# Patient Record
Sex: Male | Born: 1940 | Race: White | Hispanic: No | Marital: Married | Smoking: Never smoker
Health system: Southern US, Community
[De-identification: ages and names within clinical notes are randomized; demographics above are authoritative.]

## PROBLEM LIST (undated history)

## (undated) DIAGNOSIS — I10 Essential (primary) hypertension: Secondary | ICD-10-CM

## (undated) DIAGNOSIS — I2510 Atherosclerotic heart disease of native coronary artery without angina pectoris: Secondary | ICD-10-CM

## (undated) DIAGNOSIS — IMO0002 Reserved for concepts with insufficient information to code with codable children: Secondary | ICD-10-CM

## (undated) DIAGNOSIS — K5792 Diverticulitis of intestine, part unspecified, without perforation or abscess without bleeding: Secondary | ICD-10-CM

## (undated) DIAGNOSIS — I442 Atrioventricular block, complete: Secondary | ICD-10-CM

## (undated) HISTORY — PX: BACK SURGERY: SHX140

## (undated) HISTORY — DX: Atherosclerotic heart disease of native coronary artery without angina pectoris: I25.10

## (undated) HISTORY — DX: Atrioventricular block, complete: I44.2

## (undated) HISTORY — PX: BOWEL RESECTION: SHX1257

## (undated) HISTORY — PX: CARDIAC CATHETERIZATION: SHX172

---

## 1999-11-03 ENCOUNTER — Encounter: Payer: Self-pay | Admitting: Neurosurgery

## 1999-11-03 ENCOUNTER — Ambulatory Visit (HOSPITAL_COMMUNITY): Admission: RE | Admit: 1999-11-03 | Discharge: 1999-11-03 | Payer: Self-pay | Admitting: Neurosurgery

## 1999-11-17 ENCOUNTER — Ambulatory Visit (HOSPITAL_COMMUNITY): Admission: RE | Admit: 1999-11-17 | Discharge: 1999-11-17 | Payer: Self-pay | Admitting: Neurosurgery

## 1999-11-17 ENCOUNTER — Encounter: Payer: Self-pay | Admitting: Neurosurgery

## 1999-12-08 ENCOUNTER — Ambulatory Visit (HOSPITAL_COMMUNITY): Admission: RE | Admit: 1999-12-08 | Discharge: 1999-12-08 | Payer: Self-pay | Admitting: Neurosurgery

## 1999-12-08 ENCOUNTER — Encounter: Payer: Self-pay | Admitting: Neurosurgery

## 2000-09-17 ENCOUNTER — Encounter: Payer: Self-pay | Admitting: Emergency Medicine

## 2000-09-17 ENCOUNTER — Emergency Department (HOSPITAL_COMMUNITY): Admission: AC | Admit: 2000-09-17 | Discharge: 2000-09-17 | Payer: Self-pay

## 2002-02-14 ENCOUNTER — Encounter (INDEPENDENT_AMBULATORY_CARE_PROVIDER_SITE_OTHER): Payer: Self-pay | Admitting: Specialist

## 2002-02-14 ENCOUNTER — Ambulatory Visit (HOSPITAL_COMMUNITY): Admission: RE | Admit: 2002-02-14 | Discharge: 2002-02-14 | Payer: Self-pay | Admitting: *Deleted

## 2003-05-27 ENCOUNTER — Encounter (INDEPENDENT_AMBULATORY_CARE_PROVIDER_SITE_OTHER): Payer: Self-pay | Admitting: Specialist

## 2003-05-27 ENCOUNTER — Ambulatory Visit (HOSPITAL_COMMUNITY): Admission: RE | Admit: 2003-05-27 | Discharge: 2003-05-27 | Payer: Self-pay | Admitting: *Deleted

## 2004-10-06 ENCOUNTER — Inpatient Hospital Stay (HOSPITAL_COMMUNITY): Admission: EM | Admit: 2004-10-06 | Discharge: 2004-10-16 | Payer: Self-pay | Admitting: Emergency Medicine

## 2004-10-08 ENCOUNTER — Encounter (INDEPENDENT_AMBULATORY_CARE_PROVIDER_SITE_OTHER): Payer: Self-pay | Admitting: Specialist

## 2005-04-25 ENCOUNTER — Inpatient Hospital Stay (HOSPITAL_COMMUNITY): Admission: RE | Admit: 2005-04-25 | Discharge: 2005-05-03 | Payer: Self-pay

## 2005-04-25 ENCOUNTER — Encounter (INDEPENDENT_AMBULATORY_CARE_PROVIDER_SITE_OTHER): Payer: Self-pay | Admitting: Specialist

## 2008-06-06 ENCOUNTER — Encounter: Admission: RE | Admit: 2008-06-06 | Discharge: 2008-06-06 | Payer: Self-pay | Admitting: Internal Medicine

## 2008-07-09 ENCOUNTER — Ambulatory Visit (HOSPITAL_COMMUNITY): Admission: RE | Admit: 2008-07-09 | Discharge: 2008-07-10 | Payer: Self-pay | Admitting: Neurosurgery

## 2010-06-09 ENCOUNTER — Other Ambulatory Visit (HOSPITAL_COMMUNITY): Payer: Self-pay | Admitting: Endocrinology

## 2010-06-09 DIAGNOSIS — E0590 Thyrotoxicosis, unspecified without thyrotoxic crisis or storm: Secondary | ICD-10-CM

## 2010-06-22 ENCOUNTER — Ambulatory Visit (HOSPITAL_COMMUNITY)
Admission: RE | Admit: 2010-06-22 | Discharge: 2010-06-22 | Disposition: A | Discharge status: Home / Self Care | Payer: BC Managed Care – PPO | Source: Ambulatory Visit | Attending: Endocrinology | Admitting: Endocrinology

## 2010-06-22 DIAGNOSIS — E0590 Thyrotoxicosis, unspecified without thyrotoxic crisis or storm: Secondary | ICD-10-CM

## 2010-06-23 ENCOUNTER — Ambulatory Visit (HOSPITAL_COMMUNITY)
Admission: RE | Admit: 2010-06-23 | Discharge: 2010-06-23 | Disposition: A | Discharge status: Home / Self Care | Payer: BC Managed Care – PPO | Source: Ambulatory Visit | Attending: Endocrinology | Admitting: Endocrinology

## 2010-06-23 DIAGNOSIS — E0590 Thyrotoxicosis, unspecified without thyrotoxic crisis or storm: Secondary | ICD-10-CM | POA: Insufficient documentation

## 2010-06-23 MED ORDER — SODIUM IODIDE I 131 CAPSULE
9.7000 | Freq: Once | INTRAVENOUS | Status: AC | PRN
  Administered 2010-06-22: 9.7 via ORAL

## 2010-06-23 MED ORDER — SODIUM PERTECHNETATE TC 99M INJECTION
10.0000 | Freq: Once | INTRAVENOUS | Status: AC | PRN
  Administered 2010-06-23: 10.8 via INTRAVENOUS

## 2010-07-29 LAB — BASIC METABOLIC PANEL
BUN: 10 mg/dL (ref 6–23)
CO2: 29 mEq/L (ref 19–32)
Calcium: 10.3 mg/dL (ref 8.4–10.5)
Chloride: 106 mEq/L (ref 96–112)
Creatinine, Ser: 0.92 mg/dL (ref 0.4–1.5)
GFR calc Af Amer: >60 mL/min (ref >60)
GFR calc non Af Amer: >60 mL/min (ref >60)
Glucose, Bld: 98 mg/dL (ref 70–99)
Potassium: 4.2 mEq/L (ref 3.5–5.1)
Sodium: 142 mEq/L (ref 135–145)

## 2010-07-29 LAB — CBC
Hemoglobin: 14.1 g/dL (ref 13.0–17.0)
MCV: 98.1 fL (ref 78.0–100.0)
Platelets: 185 10*3/uL (ref 150–400)
RDW: 12.5 % (ref 11.5–15.5)
WBC: 5.7 10*3/uL (ref 4.0–10.5)

## 2010-08-31 NOTE — Op Note (Signed)
NAME:  ALDYN, TOON NO.:  [id number]   MEDICAL RECORD NO.:  [id number]          PATIENT TYPE:  AMB   LOCATION:  SDS                          FACILITY:  [facility]   PHYSICIAN:  Donalee Citrin, M.D.        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   PROCEDURE:  Lumbar laminectomy, microdiskectomy, L5-S1, left.  Microscopic dissection of left S1 nerve root with microscopic  diskectomy.   SURGEON:  Donalee Citrin, MD   ASSISTANT:  Tia Alert, MD   ANESTHESIA:  General endotracheal.   HISTORY OF PRESENT ILLNESS:  The patient is a very pleasant 70 year old  gentleman who has had progressive worsening and longstanding left leg  pain radiating down outside of his foot to bottom of his foot in an S1  nerve root pattern.  MRI scan showed a very large disk herniation with  free fragment displacing the S1 nerve root.  The patient failed all  forms of conservative treatment and due to his MRI findings, clinical  exam, and failure of conservative treatment was recommended laminectomy  and microdiskectomy.  Risks and benefits of the operation were explained  to the patient.  He understood and agreed to proceed forward.   PROCEDURE IN DETAIL:  The patient was brought to the OR and induced  under anesthesia, positioned prone on the Wilson frame.  Back was  prepped and draped in the routine sterile fashion.  The fascia localized  at the appropriate level, so after infiltration of 10 mL lidocaine with  epinephrine a midline incision was made.  Bovie electrocautery was used  to take down the subperiosteum and dissection was carried down to the  lamina of L5-S1 on the left side.  Intraoperative x-ray confirmed  localization at the appropriate level as well.  Then using a 3-mm  Kerrison punch as well as 4-mm  Kerrison punch, the inferior aspect of L5  medial facet complex and superior aspect of S1 was removed exposing the  ligamentum flavum which was removed in a piecemeal fashion exposing the  thecal sac.  Then the undersurface of the medial facet complex was  underbitten.  At this point, the operative microscope was draped and  brought into the field under microscopic illumination for further  underbiting of the lateral gutter and the medial border of the facet was  underbitten to identify the S1 nerve root.  Then using a 4-Penfield, the  S1 nerve root was dissected off a very large disk herniation, still  partially containing with ligament, although medially.  A tear was  visualized and nerve hook was used to express a large free fragment of  disk immediately from underneath the tear.  Then using a D'Errico, the  S1 nerve root was reflected medially.  Annulotomy was extended with #11  blade scalpel and disk space was adequately cleaned out.  At the end of  diskectomy, there was no further stenosis  on the thecal sac.  The extent  of the S1 nerve root was palpated from underneath with a coronary  dilator out distally along the pedicle as well as superiorly to the  superior disk space.  There was no further resistance on the thecal sac  or the S1 nerve root.  These were explored with a hockey stick as well.  After adequate decompression had been obtained, there were no further  fragments in the disk space.  The wound was copiously irrigated.  Meticulous hemostasis was  maintained.  Gelfoam was overlaid on top of the dura.  The muscle fascia  reapproximated in layers with interrupted Vicryl and the skin was closed  with running 4-0 subcuticular.  Benzoin and Steri-Strips were applied.  The patient went to the recovery room in stable condition.  At the end  of the case, instrument and sponge count was correct.           ______________________________  Donalee Citrin, M.D.     GC/MEDQ  D:   07/09/2008  T:  07/10/2008  Job:  [id number]

## 2010-09-03 NOTE — H&P (Signed)
NAME:  Ian Ritter, Ian Ritter NO.:  [id number]   MEDICAL RECORD NO.:  [id number]          PATIENT TYPE:  EMS   LOCATION:  ED                           FACILITY:  [facility]   PHYSICIAN:  Lorre Munroe., M.D.DATE OF BIRTH:  [birth year]   DATE OF ADMISSION:  10/06/2004  DATE OF DISCHARGE:                                HISTORY & PHYSICAL   CHIEF COMPLAINT:  Abdominal pain.   PRESENT ILLNESS:  This is a generally healthy 69 year old white male with 1-  week history of some GI complaints including some lower abdominal fullness,  urgency, diarrhea. He did not have severe pain until yesterday and he got  quite severe lower abdominal pain which has persisted. The pain has been  completely in the lower abdomen. He was seen in the emergency room and found  to have a slightly elevated white count and tender lower abdomen. A CT scan  of the abdomen was obtained showing pneumoperitoneum of very small volume,  very little if any free fluid in the abdominal cavity and inflammation of  sigmoid mesentery with small abscess. He was thought to have sigmoid  diverticulitis and admitted to the hospital for that. He has been told he  has diverticuli based on endoscopies in the past, but never had  diverticulitis or any other colon problems. He has been having fever and  chills. He has not been vomiting.   PAST MEDICAL HISTORY:  Generally good health. He has hypertension and takes  Toprol XL and Lisinopril. He denies any heart, lung problems. He denies  other serious chronic ailments. No medicine allergies. No operations.   FAMILY HISTORY:  Family history and childhood illnesses unremarkable.   SOCIAL HISTORY:  He does not smoke or drink alcoholic beverages.   REVIEW OF SYSTEMS:  He denies chest pain, denies shortness of breath, hurts  slightly in the abdomen when he urinates, otherwise no urinary problems.  Remainder of review of systems unremarkable.   PHYSICAL EXAM:  GENERAL:  No acute  distress. Lying quietly in bed, very  appropriate in interview.  VITAL SIGNS: per nursing staff. Heart rate is to my exam by maybe 110-120.  The temperatures recorded at 103.6.  HEENT:  The head, neck, eyes, ears, nose, mouth, throat unremarkable with a  moist oral mucosa.  NECK:  No neck mass. No thyromegaly, no carotid bruits. No supraclavicular  adenopathy.  CHEST:  Clear to auscultation. No chest wall tenderness.  HEART:  Rate and rhythm normal. No murmur or gallop. Peripheral pulses full.  ABDOMEN:  Slight distension. No upper abdominal tenderness. Marked bilateral  lower quadrant tenderness, greatest about in the midline. Fullness present  without a definite mass.  GROINS: No lymphadenopathy. No hernia is noted.  EXTREMITIES:  Good pulses, no edema. No skin lesions.  SKIN:  No suspicious nevi or other abnormalities seen. Lymph nodes not  enlarged in the groin, axilla or neck.  NEUROLOGICAL:  Grossly normal.   IMPRESSION:  Severe acute sigmoid diverticulitis.   PLAN:  Hospitalization with IV Zosyn, bowel rest, close clinical follow-up.       WB/MEDQ  D:  10/06/2004  T:  10/06/2004  Job:  [id number]

## 2010-09-03 NOTE — Op Note (Signed)
NAME:  Ian Ritter, Ian Ritter                         ACCOUNT NO.:  [id number]   MEDICAL RECORD NO.:  [id number]                   PATIENT TYPE:  AMB   LOCATION:  ENDO                                 FACILITY:  [facility]   PHYSICIAN:  Georgiana Spinner, M.D.                 DATE OF BIRTH:  11-13-1940   DATE OF PROCEDURE:  05/27/2003  DATE OF DISCHARGE:                                 OPERATIVE REPORT   PROCEDURE PERFORMED:  Upper endoscopy with biopsy.   ENDOSCOPIST:  Georgiana Spinner, M.D.   INDICATIONS FOR PROCEDURE:  Question of Barrett's, patient with  gastroesophageal reflux disease.   ANESTHESIA:  Demerol 60 mg, Versed 7 mg.   DESCRIPTION OF PROCEDURE:  With the patient mildly sedated in the left  lateral decubitus position, the Olympus video endoscope was inserted in the  mouth and passed under direct vision through the esophagus which appeared  normal until we reached the distal esophagus and there was a question of  Barrett's.  We photographed and biopsied this area.  We entered into the  stomach.  The fundus, body, antrum, duodenal bulb and second portion of the  duodenum all appeared normal.  From this point, the endoscope was slowly  withdrawn taking circumferential views of the entire duodenal mucosa until  the endoscope was pulled back into the stomach and placed on retroflexion to  view the stomach from below.  The endoscope was then straightened and  withdrawn taking circumferential views of the remaining gastric and  esophageal mucosa.  The patient's vital signs and pulse oximeter remained  stable.  The patient tolerated the procedure well without apparent  complications.   FINDINGS:  Biopsy of distal esophagus.  Await biopsy report.  Patient will  call me for results and follow up with me as an outpatient.                                               Georgiana Spinner, M.D.    GMO/MEDQ  D:  05/27/2003  T:  05/27/2003  Job:  [id number]

## 2010-09-03 NOTE — Discharge Summary (Signed)
NAME:  CAROL, LOFTIN NO.:  [id number]   MEDICAL RECORD NO.:  [id number]          PATIENT TYPE:  INP   LOCATION:  1613                         FACILITY:  [facility]   PHYSICIAN:  Lebron Conners, M.D.   DATE OF BIRTH:  [birth year]   DATE OF ADMISSION:  04/25/2005  DATE OF DISCHARGE:  05/03/2005                                 DISCHARGE SUMMARY   HISTORY OF PRESENT ILLNESS:  Mr. Bogden is a 70 year old white male who is  several months status post colostomy and stapling of the distal segment  because of acute perforated diverticulitis with abscess. He had made a good  recovery. He had developed a little bit of a bulge around his colostomy but  it continued to function well. Endoscopy of the colostomy and rectum prior  to admission showed no persistent severe diverticular change, stricture or  tumor. He took a bowel prep as an outpatient and presented requesting  reversal of the colostomy. His medical history is prominent for  hypertension. He is on Toprol XL and Prinivil. No allergies. He is a  nonsmoker, nondrinker.   HOSPITAL COURSE:  On the day of admission he underwent closure of colostomy  by resection of the colostomy and anastomosis to the distal segment. At that  time I primarily repaired the small para-colostomy hernia. Postoperatively  he had slow GI return but developed no GI complications. A couple of days  before discharge he developed a purulent wound infection and the wound had  to be opened. I drained the pus, noted the fascia was intact and instituted  dressing changes. His wife had done his dressing changes for his wound after  his previous operation and was confident in care of this. She agreed to do  it and on May 03, 2005 I let him go home and made arrangements to see  him in the office in a couple weeks. The infection appeared to be under good  control.   DISCHARGE DIAGNOSES:  1.  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  2.   Hypertension.  3.  Wound infection.   OPERATION:  Closure of colostomy.   CONDITION ON DISCHARGE:  Improved.      Lebron Conners, M.D.  Electronically Signed     WB/MEDQ  D:  05/11/2005  T:  05/11/2005  Job:  [id number]

## 2010-09-03 NOTE — Op Note (Signed)
   NAME:  Ian Ritter, Ian Ritter                         ACCOUNT NO.:  [id number]   MEDICAL RECORD NO.:  [id number]                   PATIENT TYPE:  AMB   LOCATION:  ENDO                                 FACILITY:  [facility]   PHYSICIAN:  Georgiana Spinner, M.D.                 DATE OF BIRTH:  [birth year]   DATE OF PROCEDURE:  02/14/2002  DATE OF DISCHARGE:                                 OPERATIVE REPORT   PROCEDURE:  Colonoscopy.   INDICATIONS:  Hemoccult positivity, colon cancer screening.   ANESTHESIA:  Versed 5 mg.   DESCRIPTION OF PROCEDURE:  With the patient mildly sedated in the left  lateral decubitus position, the Olympus videoscopic colonoscope was inserted  directly after a normal rectal exam was done and passed under direct vision  to the cecum, identified by ileocecal valve and appendiceal orifice.  From  this point the colonoscope was slowly withdrawn, taking circumferential  views of the entire colonic mucosa, stopping only in the rectum, which  appeared normal on direct and hemorrhoids on retroflex view.  This may have  been the cause of the patient's rectal bleeding.  The patient's vital signs  and pulse oximetry remained stable.  The patient tolerated the procedure  well without apparent complications.   FINDINGS:  Internal hemorrhoids, otherwise unremarkable colonoscopic  examination to the cecum.   PLAN:  See endoscopy note for further details.                                               Georgiana Spinner, M.D.    GMO/MEDQ  D:  02/14/2002  T:  02/14/2002  Job:  [id number]   cc:   Lacretia Leigh. Quintella Reichert, M.D.

## 2010-09-03 NOTE — Discharge Summary (Signed)
NAME:  Ian Ritter, Ian Ritter NO.:  [id number]   MEDICAL RECORD NO.:  [id number]          PATIENT TYPE:  INP   LOCATION:  1606                         FACILITY:  [facility]   PHYSICIAN:  Lorre Munroe., M.D.DATE OF BIRTH:  [birth year]   DATE OF ADMISSION:  10/06/2004  DATE OF DISCHARGE:  10/16/2004                                 DISCHARGE SUMMARY   HISTORY:  This is a 70 year old white man with a 1 week history of lower  abdominal pain, fullness, urgency, and diarrhea. CT scan shows  pneumoperitoneum and a little free fluid in the abdomen. There was evidence  of sigmoid diverticulitis. He is admitted to the hospital for treatment of  that. He has hypertension controlled by Toprol XL and lisinopril. He denies  other serious problems. See history and physical for further details.   HOSPITAL COURSE:  I admitted the patient and started him on broad-spectrum  antibiotics in hopes of getting the diverticulitis to cool down and do an  elective colectomy on it. However, he continued to have significant pain and  tenderness, and his white count rose to 19,000. I advised colectomy and he  consented. On 10/08/2004, I took him to the operating room and did a sigmoid  colectomy with colostomy and stapling of the distal segment. The patient  tolerated the operation without complication and made slow but steady  progress thereafter. He had no wound or GI complications. By a October 16, 2004,  he was eating well, felt he could manage his colostomy well, and was felt to  be ready to go home.   DISCHARGE INSTRUCTIONS:  I discharged him with instructions to see me in  about 10 days at the office. They are to call if they are in need of any  assistance with the care stoma, etc.   Pathology confirmed diverticulitis without any evidence of malignancy.   DIAGNOSES:  1.  Acute diverticulitis with perforation, sigmoid colon.  2.  Hypertension, stable.   OPERATION:  Sigmoid colectomy with  colostomy and stapling of the distal  segment.   DISCHARGE CONDITION:  Improved.       WB/MEDQ  D:  11/14/2004  T:  11/15/2004  Job:  [id number]

## 2010-09-03 NOTE — Op Note (Signed)
NAME:  Ian Ritter, Ian Ritter NO.:  [id number]   MEDICAL RECORD NO.:  [id number]          PATIENT TYPE:  INP   LOCATION:  0007                         FACILITY:  [facility]   PHYSICIAN:  Lebron Conners, M.D.   DATE OF BIRTH:  [birth year]   DATE OF PROCEDURE:  04/25/2005  DATE OF DISCHARGE:                                 OPERATIVE REPORT   PREOPERATIVE DIAGNOSIS:  Colostomy status, status post resection of colon  for diverticulitis.   POSTOPERATIVE DIAGNOSIS:  Colostomy status, status post resection of colon  for diverticulitis.   OPERATION:  Resection and closure of colostomy.   SURGEON:  Lebron Conners, M.D.   ASSISTANT:  Clovis Pu. Cornett, M.D.   ANESTHESIA:  General.   SPECIMEN:  Colostomy.   BLOOD LOSS:  Minimal.   COMPLICATIONS:  None.   DESCRIPTION OF PROCEDURE:  After the patient was monitored and anesthetized  and had routine preparation and draping of the abdomen and had a Foley  catheter in place and had the colostomy sewn closed with pursestring suture,  I proceeded with elliptical incision around the colostomy going all the way  through the skin. I dissected down through the subcutaneous tissues and  entered a paracolostomy hernia sac and opened that sac all the way around  the colostomy. I then took down adhesions of the colostomy to the abdominal  parietes and to the omentum and to the small bowel and had a nice length of  excellent appearing proximal bowel. I then pushed that back inside and  dissected into the area of the right lateral pelvic wall and identified two  Prolene sutures which I had used to mark the end of the distal bowel. I then  freed the bowel up cutting adhesions to the lateral pelvic wall and to the  small bowel and was able to bring the end of the bowel up into the wound. I  felt that I could perform satisfactory anastomosis at that point. I placed  stay sutures in the distal bowel and I dissected the colostomy free back to  bowel which had resided within the abdomen and had a healthy appearance. I  segmentally divided the mesentery at that point using 2-0 silk ligatures. I  then placed a spring clamp cross the proximal bowel and a Kocher clamp  across the colostomy and cut underneath the Kocher clamp and saw that I had  very nice blood supply and nice appearing intestine proximally. I performed  an end to end anastomosis in a single layer of sutures using 2-0 and 3-0  silk sutures incorporating all layers of the bowel wall and all stitches. I  reinforced that at a couple of points I felt might not be quite adequately  closed and was quite satisfied with the anastomosis. After cutting the  sutures, I dropped that back into abdomen. I briefly irrigated the area and  observed that hemostasis was good. Sponge, needle and instrument counts were  correct. I then freed up the colostomy site muscles somewhat further  dissecting laterally a bit into the subcutaneous tissues in all directions  and making  sure that all the adhesions were taken down. I then closed the  site with running #1 PDS beginning at each end and tying a knot also in the  middle and felt that attention was  good. I then irrigated the subcutaneous tissues and got hemostasis there. I  closed the skin with staples closing it loosely and packing some moist gauze  between the staples to provide some drainage. The patient tolerated the  operation well and went to PACU in good condition.      Lebron Conners, M.D.  Electronically Signed     WB/MEDQ  D:  04/25/2005  T:  04/25/2005  Job:  [id number]   cc:   Juline Patch, M.D.  Fax: [phone]

## 2010-09-03 NOTE — Op Note (Signed)
NAME:  Ian Ritter, TRAINER NO.:  [id number]   MEDICAL RECORD NO.:  [id number]          PATIENT TYPE:  INP   LOCATION:  1606                         [facility]ACILITY:  [facility]   PHYSICIAN:  Lorre Munroe., M.D.DATE OF BIRTH:  09/12/1940   DATE OF PROCEDURE:  10/08/2004  DATE OF DISCHARGE:                                 OPERATIVE REPORT   PREOPERATIVE DIAGNOSIS:  Diverticulitis of the sigmoid colon with  perforation and abscess.   POSTOPERATIVE DIAGNOSIS:  Diverticulitis of the sigmoid colon with  perforation and abscess.   OPERATION:  Sigmoid colectomy with colostomy and stapling of the distal  segment (Hartmann procedure).   SURGEON:  Lebron Conners, M.D.   ASSISTANT:  Ollen Gross. Carolynne Edouard, M.D.   ANESTHESIA:  General.   SPECIMEN:  Sigmoid colon.   ESTIMATED BLOOD LOSS:  Minimal.   COMPLICATIONS:  None.   CONDITION:  To PACU good.   DESCRIPTION OF PROCEDURE:  After the patient was monitored and anesthetized  and had a Foley catheter and routine preparation and draping of the abdomen,  I made a lower midline incision from just below the umbilicus down to the  pubic symphysis and carefully entered the peritoneal cavity. I noted a  little bit of cloudy fluid and noted an inflammatory mass in the left lower  abdomen and lower midline. I packed the small bowel upward after briefly  exploring the upper abdomen and finding no abnormalities. I took down  adhesions of the sigmoid colon to the lateral pelvic wall and came into an  abscess and took a culture. I then removed the fluid from the abscess and  mobilized the sigmoid colon. The site of perforation was found to be in the  distal sigmoid. I divided the colon with a cutting stapler just proximal to  the midpoint of the sigmoid and then segmentally divided the mesentery  between clamps and ligated the vessels with 2-0 silk. I dissected well past  the inflammatory mass to the area where the rectosigmoid became soft. I  divided again with the cutting stapler at that point. Hemostasis was good. I  used two sutures of 2-0 Prolene to label the end of the cut part of the  bowel. I then sewed that over to the left lateral pelvic wall so it would be  easy to find at the time of the colostomy closure. I made a site for the  colostomy bringing it up as high as I could to just above the level of the  umbilicus and made about a 3-4 cm round skin incision there, removed some  fat and divided some rectus muscle and the posterior sheath and dilated the  tract to three fingerbreadth's. I pulled the colon through and it came up  nicely. I held it there with a Babcock clamp. I then copiously irrigated the  pelvis and the lower abdominal cavity and removed the irrigant. I removed  the sponges and sponge, needle and instrument count was correct. I closed  the fascia with a running #1 PDS and closed the skin loosely with staples  packing some gauze  between the staples. I then matured the colostomy by removing the staples  and suturing the bowel wall to the skin with 3-0 Vicryl. I could easily  passed my finger through the fascia. The mucosa looked healthy. I applied  bandages and a colostomy pouch. He tolerated the operation well.       WB/MEDQ  D:  10/08/2004  T:  10/08/2004  Job:  [id number]

## 2010-09-03 NOTE — Op Note (Signed)
   NAME:  Ian Ritter, Ian Ritter                         ACCOUNT NO.:  [id number]   MEDICAL RECORD NO.:  [id number]                   PATIENT TYPE:  AMB   LOCATION:  ENDO                                 FACILITY:  [facility]   PHYSICIAN:  Georgiana Spinner, M.D.                 DATE OF BIRTH:  [birth year]   DATE OF PROCEDURE:  02/14/2002  DATE OF DISCHARGE:                                 OPERATIVE REPORT   PROCEDURE PERFORMED:  Upper endoscopy.   ENDOSCOPIST:  Georgiana Spinner, M.D.   INDICATIONS FOR PROCEDURE:  Gastroesophageal reflux disease.   ANESTHESIA:  Demerol  50 mg, Versed 5 mg.   DESCRIPTION OF PROCEDURE:  With the patient mildly sedated in the left  lateral decubitus position, the Olympus video endoscope was inserted in the  mouth and passed under direct vision through the esophagus.  The distal  esophagus was approached and there was a question of an area of Barrett's  esophagus photographed and biopsied. We entered into the stomach and diffuse  erythema was seen in the fundus and body of the stomach.  This was  photographed and it, too was biopsied.  The antrum, duodenal bulb and second  portion of the duodenum all appeared normal.  From this point, the endoscope  was slowly withdrawn taking circumferential views of the duodenal mucosa  until the endoscope was pulled back into the stomach and placed on  retroflexion to view the stomach from below.  The endoscope was then  straightened and withdrawn taking circumferential views of the remaining  gastric and esophageal mucosa.  The patient's vital signs and pulse oximeter  remained stable.  The patient tolerated the procedure well without apparent  complications.   FINDINGS:  Question of Barrett's esophagus, biopsied.  Gastric erythema in  body and fundus, biopsied.  Await biopsy report.  Patient will call me for  results and follow up with me as an outpatient.  Proceed to colonoscopy as  planned.                          Georgiana Spinner, M.D.    GMO/MEDQ  D:  02/14/2002  T:  02/14/2002  Job:  [id number]   cc:   Lacretia Leigh. Quintella Reichert, M.D.

## 2011-11-25 ENCOUNTER — Encounter (HOSPITAL_COMMUNITY): Payer: Self-pay | Admitting: Emergency Medicine

## 2011-11-25 ENCOUNTER — Inpatient Hospital Stay (HOSPITAL_COMMUNITY)
Admission: EM | Admit: 2011-11-25 | Discharge: 2011-12-03 | DRG: 160 | Disposition: A | Discharge status: Home / Self Care | Payer: BC Managed Care – PPO | Attending: Surgery | Admitting: Surgery

## 2011-11-25 DIAGNOSIS — K439 Ventral hernia without obstruction or gangrene: Secondary | ICD-10-CM

## 2011-11-25 DIAGNOSIS — I10 Essential (primary) hypertension: Secondary | ICD-10-CM | POA: Diagnosis present

## 2011-11-25 DIAGNOSIS — Z9049 Acquired absence of other specified parts of digestive tract: Secondary | ICD-10-CM

## 2011-11-25 DIAGNOSIS — Z8719 Personal history of other diseases of the digestive system: Secondary | ICD-10-CM

## 2011-11-25 DIAGNOSIS — Z79899 Other long term (current) drug therapy: Secondary | ICD-10-CM

## 2011-11-25 DIAGNOSIS — K56609 Unspecified intestinal obstruction, unspecified as to partial versus complete obstruction: Secondary | ICD-10-CM

## 2011-11-25 DIAGNOSIS — K430 Incisional hernia with obstruction, without gangrene: Principal | ICD-10-CM | POA: Diagnosis present

## 2011-11-25 HISTORY — DX: Diverticulitis of intestine, part unspecified, without perforation or abscess without bleeding: K57.92

## 2011-11-25 HISTORY — DX: Essential (primary) hypertension: I10

## 2011-11-25 HISTORY — DX: Reserved for concepts with insufficient information to code with codable children: IMO0002

## 2011-11-25 LAB — CBC WITH DIFFERENTIAL/PLATELET
Basophils Absolute: 0 10*3/uL (ref 0.0–0.1)
Basophils Relative: 0 % (ref 0–1)
Eosinophils Absolute: 0 10*3/uL (ref 0.0–0.7)
Eosinophils Relative: 0 % (ref 0–5)
HCT: 51.7 % (ref 39.0–52.0)
Hemoglobin: 17.6 g/dL — ABNORMAL HIGH (ref 13.0–17.0)
Lymphocytes Relative: 8 % — ABNORMAL LOW (ref 12–46)
Lymphs Abs: 0.5 10*3/uL — ABNORMAL LOW (ref 0.7–4.0)
MCH: 32.4 pg (ref 26.0–34.0)
MCHC: 34 g/dL (ref 30.0–36.0)
MCV: 95 fL (ref 78.0–100.0)
Monocytes Absolute: 0.9 10*3/uL (ref 0.1–1.0)
Monocytes Relative: 16 % — ABNORMAL HIGH (ref 3–12)
Neutro Abs: 4.3 10*3/uL (ref 1.7–7.7)
Neutrophils Relative %: 75 % (ref 43–77)
Platelets: 222 10*3/uL (ref 150–400)
RBC: 5.44 MIL/uL (ref 4.22–5.81)
RDW: 12.6 % (ref 11.5–15.5)
WBC: 5.7 10*3/uL (ref 4.0–10.5)

## 2011-11-25 LAB — URINALYSIS, ROUTINE W REFLEX MICROSCOPIC
Bilirubin Urine: NEGATIVE
Glucose, UA: NEGATIVE mg/dL
Hgb urine dipstick: NEGATIVE
Ketones, ur: 15 mg/dL — AB
Leukocytes, UA: NEGATIVE
Nitrite: NEGATIVE
Protein, ur: 30 mg/dL — AB
Specific Gravity, Urine: 1.03 (ref 1.005–1.030)
Urobilinogen, UA: 0.2 mg/dL (ref 0.0–1.0)
pH: 6 (ref 5.0–8.0)

## 2011-11-25 LAB — COMPREHENSIVE METABOLIC PANEL
ALT: 25 U/L (ref 0–53)
AST: 22 U/L (ref 0–37)
Albumin: 4.2 g/dL (ref 3.5–5.2)
Alkaline Phosphatase: 85 U/L (ref 39–117)
BUN: 21 mg/dL (ref 6–23)
CO2: 24 mEq/L (ref 19–32)
Calcium: 10.1 mg/dL (ref 8.4–10.5)
Chloride: 102 mEq/L (ref 96–112)
Creatinine, Ser: 0.91 mg/dL (ref 0.50–1.35)
GFR calc Af Amer: >90 mL/min (ref >90)
GFR calc non Af Amer: 83 mL/min — ABNORMAL LOW (ref >90)
Glucose, Bld: 143 mg/dL — ABNORMAL HIGH (ref 70–99)
Potassium: 3.9 mEq/L (ref 3.5–5.1)
Sodium: 141 mEq/L (ref 135–145)
Total Bilirubin: 1.1 mg/dL (ref 0.3–1.2)
Total Protein: 7.4 g/dL (ref 6.0–8.3)

## 2011-11-25 LAB — LACTIC ACID, PLASMA: Lactic Acid, Venous: 1.8 mmol/L (ref 0.5–2.2)

## 2011-11-25 LAB — POCT I-STAT, CHEM 8
Chloride: 106 mEq/L (ref 96–112)
Creatinine, Ser: 1 mg/dL (ref 0.50–1.35)
Glucose, Bld: 146 mg/dL — ABNORMAL HIGH (ref 70–99)
HCT: 55 % — ABNORMAL HIGH (ref 39.0–52.0)
Hemoglobin: 18.7 g/dL — ABNORMAL HIGH (ref 13.0–17.0)
Potassium: 4 mEq/L (ref 3.5–5.1)
Sodium: 142 mEq/L (ref 135–145)

## 2011-11-25 LAB — URINE MICROSCOPIC-ADD ON

## 2011-11-25 LAB — PROTIME-INR
INR: 1 (ref 0.00–1.49)
Prothrombin Time: 13.4 seconds (ref 11.6–15.2)

## 2011-11-25 LAB — LIPASE, BLOOD: Lipase: 12 U/L (ref 11–59)

## 2011-11-25 MED ORDER — ONDANSETRON 8 MG/NS 50 ML IVPB
8.0000 mg | Freq: Once | INTRAVENOUS | Status: AC
  Administered 2011-11-26: 8 mg via INTRAVENOUS
  Filled 2011-11-25: qty 8

## 2011-11-25 MED ORDER — MORPHINE SULFATE 4 MG/ML IJ SOLN
4.0000 mg | Freq: Once | INTRAMUSCULAR | Status: AC
  Administered 2011-11-26: 4 mg via INTRAVENOUS
  Filled 2011-11-25: qty 1

## 2011-11-25 MED ORDER — SODIUM CHLORIDE 0.9 % IV BOLUS (SEPSIS)
1000.0000 mL | Freq: Once | INTRAVENOUS | Status: AC
  Administered 2011-11-26: 1000 mL via INTRAVENOUS

## 2011-11-25 NOTE — ED Notes (Signed)
Pt states yesterday he started having abd pain and gas  Pt states he ate chicken noodle soup for supper and during the night he had some nausea and vomited a couple times this morning  Pt states he went to see his dr this am and had xrays and states he was told he may have a partial blockage and lots of gas  Pt was told if he had any further vomiting to come here for evaluation  Pt states he has been drinking water  Pt states this afternoon and this evening he has had vomiting and has had some episodes where he would get diaphoretic

## 2011-11-26 ENCOUNTER — Emergency Department (HOSPITAL_COMMUNITY): Payer: BC Managed Care – PPO

## 2011-11-26 DIAGNOSIS — K430 Incisional hernia with obstruction, without gangrene: Secondary | ICD-10-CM

## 2011-11-26 MED ORDER — IOHEXOL 300 MG/ML  SOLN
100.0000 mL | Freq: Once | INTRAMUSCULAR | Status: AC | PRN
  Administered 2011-11-26: 100 mL via INTRAVENOUS

## 2011-11-26 MED ORDER — HYDROMORPHONE HCL PF 1 MG/ML IJ SOLN
0.5000 mg | INTRAMUSCULAR | Status: DC | PRN
  Administered 2011-11-26: 1 mg via INTRAVENOUS

## 2011-11-26 MED ORDER — HYDROMORPHONE HCL PF 1 MG/ML IJ SOLN
INTRAMUSCULAR | Status: AC
  Filled 2011-11-26: qty 1

## 2011-11-26 MED ORDER — KCL IN DEXTROSE-NACL 30-5-0.45 MEQ/L-%-% IV SOLN
INTRAVENOUS | Status: DC
  Administered 2011-11-26 – 2011-12-02 (×15): via INTRAVENOUS
  Filled 2011-11-26 (×19): qty 1000

## 2011-11-26 MED ORDER — SODIUM CHLORIDE 0.9 % IV SOLN
INTRAVENOUS | Status: DC
  Administered 2011-11-26: 06:00:00 via INTRAVENOUS

## 2011-11-26 MED ORDER — HYDROMORPHONE HCL PF 1 MG/ML IJ SOLN
1.0000 mg | INTRAMUSCULAR | Status: AC | PRN
  Administered 2011-11-26 (×3): 1 mg via INTRAVENOUS
  Filled 2011-11-26 (×3): qty 1

## 2011-11-26 MED ORDER — ONDANSETRON HCL 4 MG/2ML IJ SOLN: 4.0000 mg | Freq: Three times a day (TID) | INTRAMUSCULAR | Status: AC | PRN

## 2011-11-26 MED ORDER — ONDANSETRON HCL 4 MG/2ML IJ SOLN
4.0000 mg | INTRAMUSCULAR | Status: DC | PRN
  Administered 2011-11-27: 4 mg via INTRAVENOUS
  Filled 2011-11-26: qty 2

## 2011-11-26 MED ORDER — PANTOPRAZOLE SODIUM 40 MG IV SOLR
40.0000 mg | Freq: Every day | INTRAVENOUS | Status: DC
  Administered 2011-11-26 – 2011-12-02 (×7): 40 mg via INTRAVENOUS
  Filled 2011-11-26 (×8): qty 40

## 2011-11-26 NOTE — Progress Notes (Signed)
Pt alert x4 , v/s stable, tx from ed, family at bedside, skin is dry and intact, abd distended. Ng to low wall suction 200cc of brownish blood tinge drainage prn pain med's given. Page Dr Gerrit Friends on the Pt arrival will continue to monitor. Report given to day shift RN.

## 2011-11-26 NOTE — Progress Notes (Signed)
Repaged Dr Abbey Chatters.  Notified of patients complaint of headache.  Order given.  Patient medicated.  Call light within.  Family at bedside.

## 2011-11-26 NOTE — ED Provider Notes (Signed)
History    71yM with abdominal pain and vomiting. Onset of symptoms 2d ago. Pain diffuse. Crampy. Waxes and wanes but doesn't completely go away.  No appreciable exacerbating or relieving factors. Nausea and vomiting. Multiple episodes. No fever or chills. No urinary complaints. 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  CSN: [id number]  Arrival date & time 11/25/11  2044   First MD Initiated Contact with Patient 11/25/11 2334      Chief Complaint  Patient presents with  . Abdominal Pain  . Emesis    (Consider location/radiation/quality/duration/timing/severity/associated sxs/prior treatment) HPI  Past Medical History  Diagnosis Date  . Diverticulitis   . Herniated disc   . Hypertension     Past Surgical History  Procedure Date  . Bowel resection   . Back surgery     Family History  Problem Relation Age of Onset  . Hypertension Other     History  Substance Use Topics  . Smoking status: Never Smoker   . Smokeless tobacco: Not on file  . Alcohol Use: No      Review of Systems   Review of symptoms negative unless otherwise noted in HPI.   Allergies  Review of patient's allergies indicates no known allergies.  Home Medications   Current Outpatient Rx  Name Route Sig Dispense Refill  . LISINOPRIL 20 MG PO TABS Oral Take 20 mg by mouth daily.    Marland Kitchen METOPROLOL TARTRATE 50 MG PO TABS Oral Take 50 mg by mouth 2 (two) times daily.    Marland Kitchen ONE-DAILY MULTI VITAMINS PO TABS Oral Take 1 tablet by mouth daily.    . SENNOSIDES 8.6 MG PO TABS Oral Take 1 tablet by mouth as needed.    Marland Kitchen SIMETHICONE 80 MG PO CHEW Oral Chew 80 mg by mouth every 6 (six) hours as needed.      BP 133/76  Pulse 107  Temp 99 F (37.2 C) (Oral)  Resp 21  SpO2 91%  Physical Exam  Nursing note and vitals reviewed. Constitutional: He appears well-developed and well-nourished. No distress.       Laying in bed. NAD.  HENT:  Head: Normocephalic and  atraumatic.  Eyes: Conjunctivae are normal. Right eye exhibits no discharge. Left eye exhibits no discharge.  Neck: Neck supple.  Cardiovascular: Regular rhythm and normal heart sounds.  Exam reveals no gallop and no friction rub.   No murmur heard.      mildly tachycardic  Pulmonary/Chest: Effort normal and breath sounds normal. No respiratory distress.  Abdominal: Soft. He exhibits distension. There is no tenderness.       Distended. Nontender. No masses palpated. Well healed surgical scars.  Musculoskeletal: He exhibits no edema and no tenderness.  Neurological: He is alert.  Skin: Skin is warm and dry. He is not diaphoretic.  Psychiatric: He has a normal mood and affect. His behavior is normal. Thought content normal.    ED Course  Procedures (including critical care time)  Labs Reviewed  CBC WITH DIFFERENTIAL - Abnormal; Notable for the following:    Hemoglobin 17.6 (*)     Lymphocytes Relative 8 (*)     Lymphs Abs 0.5 (*)     Monocytes Relative 16 (*)     All other components within normal limits  COMPREHENSIVE METABOLIC PANEL - Abnormal; Notable for the following:    Glucose, Bld 143 (*)     GFR calc non Af Amer 83 (*)     All other  components within normal limits  URINALYSIS, ROUTINE W REFLEX MICROSCOPIC - Abnormal; Notable for the following:    Ketones, ur 15 (*)     Protein, ur 30 (*)     All other components within normal limits  POCT I-STAT, CHEM 8 - Abnormal; Notable for the following:    Glucose, Bld 146 (*)     Hemoglobin 18.7 (*)     HCT 55.0 (*)     All other components within normal limits  URINE MICROSCOPIC-ADD ON - Abnormal; Notable for the following:    Crystals CA OXALATE CRYSTALS (*)     All other components within normal limits  LIPASE, BLOOD  PROTIME-INR  LACTIC ACID, PLASMA   Ct Abdomen Pelvis W Contrast  11/26/2011  *RADIOLOGY REPORT*  Clinical Data: Nausea, vomiting, abnormal radiograph  CT ABDOMEN AND PELVIS WITH CONTRAST  Technique:   Multidetector CT imaging of the abdomen and pelvis was performed following the standard protocol during bolus administration of intravenous contrast.  Contrast: OMNIPAQUE IOHEXOL 300 MG/ML  SOLN  Comparison: 11/25/2011 radiograph, 10/06/2004 CT  Findings: Bibasilar opacities.  No pleural effusion or pneumothorax.  Low attenuation of the liver suggests fatty infiltration.  There is a calcification along the periphery the right lobe.  Subcentimeter hypodensity within the right lobe.  Unremarkable spleen, pancreas, biliary system, adrenal glands.  Bilateral renal cysts and too small further characterize hypodensities.  Otherwise symmetric renal enhancement.  No hydronephrosis or hydroureter.  There is a small bowel obstruction secondary to a small bowel containing hernia in the left lower quadrant. Proximal small bowel loops dilated up to 4.5 cm.  No free intraperitoneal air or fluid. No lymphadenopathy.  Normal caliber vasculature.  Thin-walled bladder.  Multilevel degenerative changes of the imaged spine. No acute or aggressive appearing osseous lesion.  Leftward curvature of the spine.  IMPRESSION: Small bowel obstruction with transition at a small bowel containing left lower quadrant anterior abdominal wall hernia.  Original Report Authenticated By: Waneta Martins, M.D.     1. SBO (small bowel obstruction)   2. Abdominal wall hernia at previous stoma site       MDM  71ym with abdominal pain and vomiting. Clinically obstruction with diffuse crampy pain, distension, ongoing vomiting and hx of prior abdominal surgery. Plan basic labs. IVF. NPO. CT a/p to further evaluate.  CT significant for SBO with transition at bowel containing abdominal wall hernia. No significant tenderness or overlying skin changes to suggest strangulation. Discussed with surgery. NG. IVF. PRN pain meds. Admit.       Raeford Razor, MD 11/26/11 [phone]

## 2011-11-26 NOTE — Progress Notes (Signed)
Pt complains of headache.  Patients pain medicine order expired.  Paged Dr Abbey Chatters awaiting response. Informed patient and family.  Call light within reach and lights dimmed.

## 2011-11-26 NOTE — H&P (Signed)
Ian Ritter is an 71 y.o. male.    Chief Complaint: small bowel obstruction, abdominal pain  HPI: patient is a 71 yo WM who presents to ER with 2 day hx of abdominal distension, abdominal pain, and no BM's.  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  CTA shows herniation at old stoma site with obstruction of small bowel.  Admit to General Surgery service for management.  Past Medical History  Diagnosis Date  . Diverticulitis   . Herniated disc   . Hypertension     Past Surgical History  Procedure Date  . Bowel resection   . Back surgery     Family History  Problem Relation Age of Onset  . Hypertension Other    Social History:  reports that he has never smoked. He does not have any smokeless tobacco history on file. He reports that he does not drink alcohol or use illicit drugs.  Allergies: No Known Allergies  Medications Prior to Admission  Medication Sig Dispense Refill  . lisinopril (PRINIVIL,ZESTRIL) 20 MG tablet Take 20 mg by mouth daily.      . metoprolol (LOPRESSOR) 50 MG tablet Take 50 mg by mouth 2 (two) times daily.      . Multiple Vitamin (MULTIVITAMIN) tablet Take 1 tablet by mouth daily.      Marland Kitchen senna (SENOKOT) 8.6 MG tablet Take 1 tablet by mouth as needed.      . simethicone (GAS-X) 80 MG chewable tablet Chew 80 mg by mouth every 6 (six) hours as needed.        Results for orders placed during the hospital encounter of 11/25/11 (from the past 48 hour(s))  LACTIC ACID, PLASMA     Status: Normal   Collection Time   11/25/11 10:21 PM      Component Value Range Comment   Lactic Acid, Venous 1.8  0.5 - 2.2 mmol/L   CBC WITH DIFFERENTIAL     Status: Abnormal   Collection Time   11/25/11 10:36 PM      Component Value Range Comment   WBC 5.7  4.0 - 10.5 K/uL    RBC 5.44  4.22 - 5.81 MIL/uL    Hemoglobin 17.6 (*) 13.0 - 17.0 g/dL    HCT 91.4  78.2 - 95.6 %    MCV 95.0  78.0 - 100.0 fL    MCH 32.4  26.0 - 34.0 pg      MCHC 34.0  30.0 - 36.0 g/dL    RDW 21.3  08.6 - 57.8 %    Platelets 222  150 - 400 K/uL    Neutrophils Relative 75  43 - 77 %    Neutro Abs 4.3  1.7 - 7.7 K/uL    Lymphocytes Relative 8 (*) 12 - 46 %    Lymphs Abs 0.5 (*) 0.7 - 4.0 K/uL    Monocytes Relative 16 (*) 3 - 12 %    Monocytes Absolute 0.9  0.1 - 1.0 K/uL    Eosinophils Relative 0  0 - 5 %    Eosinophils Absolute 0.0  0.0 - 0.7 K/uL    Basophils Relative 0  0 - 1 %    Basophils Absolute 0.0  0.0 - 0.1 K/uL   COMPREHENSIVE METABOLIC PANEL     Status: Abnormal   Collection Time   11/25/11 10:36 PM      Component Value Range Comment   Sodium 141  135 - 145 mEq/L  Potassium 3.9  3.5 - 5.1 mEq/L    Chloride 102  96 - 112 mEq/L    CO2 24  19 - 32 mEq/L    Glucose, Bld 143 (*) 70 - 99 mg/dL    BUN 21  6 - 23 mg/dL    Creatinine, Ser 1.61  0.50 - 1.35 mg/dL    Calcium 09.6  8.4 - 10.5 mg/dL    Total Protein 7.4  6.0 - 8.3 g/dL    Albumin 4.2  3.5 - 5.2 g/dL    AST 22  0 - 37 U/L    ALT 25  0 - 53 U/L    Alkaline Phosphatase 85  39 - 117 U/L    Total Bilirubin 1.1  0.3 - 1.2 mg/dL    GFR calc non Af Amer 83 (*) >90 mL/min    GFR calc Af Amer >90  >90 mL/min   LIPASE, BLOOD     Status: Normal   Collection Time   11/25/11 10:36 PM      Component Value Range Comment   Lipase 12  11 - 59 U/L   PROTIME-INR     Status: Normal   Collection Time   11/25/11 10:36 PM      Component Value Range Comment   Prothrombin Time 13.4  11.6 - 15.2 seconds    INR 1.00  0.00 - 1.49   POCT I-STAT, CHEM 8     Status: Abnormal   Collection Time   11/25/11 10:50 PM      Component Value Range Comment   Sodium 142  135 - 145 mEq/L    Potassium 4.0  3.5 - 5.1 mEq/L    Chloride 106  96 - 112 mEq/L    BUN 21  6 - 23 mg/dL    Creatinine, Ser 0.45  0.50 - 1.35 mg/dL    Glucose, Bld 409 (*) 70 - 99 mg/dL    Calcium, Ion 8.11  9.14 - 1.30 mmol/L    TCO2 22  0 - 100 mmol/L    Hemoglobin 18.7 (*) 13.0 - 17.0 g/dL    HCT 78.2 (*) 95.6 - 52.0 %    URINALYSIS, ROUTINE W REFLEX MICROSCOPIC     Status: Abnormal   Collection Time   11/25/11 11:06 PM      Component Value Range Comment   Color, Urine YELLOW  YELLOW    APPearance CLEAR  CLEAR    Specific Gravity, Urine 1.030  1.005 - 1.030    pH 6.0  5.0 - 8.0    Glucose, UA NEGATIVE  NEGATIVE mg/dL    Hgb urine dipstick NEGATIVE  NEGATIVE    Bilirubin Urine NEGATIVE  NEGATIVE    Ketones, ur 15 (*) NEGATIVE mg/dL    Protein, ur 30 (*) NEGATIVE mg/dL    Urobilinogen, UA 0.2  0.0 - 1.0 mg/dL    Nitrite NEGATIVE  NEGATIVE    Leukocytes, UA NEGATIVE  NEGATIVE   URINE MICROSCOPIC-ADD ON     Status: Abnormal   Collection Time   11/25/11 11:06 PM      Component Value Range Comment   Squamous Epithelial / LPF RARE  RARE    Crystals CA OXALATE CRYSTALS (*) NEGATIVE    Ct Abdomen Pelvis W Contrast  11/26/2011  *RADIOLOGY REPORT*  Clinical Data: Nausea, vomiting, abnormal radiograph  CT ABDOMEN AND PELVIS WITH CONTRAST  Technique:  Multidetector CT imaging of the abdomen and pelvis was performed following the standard protocol during bolus administration  of intravenous contrast.  Contrast: OMNIPAQUE IOHEXOL 300 MG/ML  SOLN  Comparison: 11/25/2011 radiograph, 10/06/2004 CT  Findings: Bibasilar opacities.  No pleural effusion or pneumothorax.  Low attenuation of the liver suggests fatty infiltration.  There is a calcification along the periphery the right lobe.  Subcentimeter hypodensity within the right lobe.  Unremarkable spleen, pancreas, biliary system, adrenal glands.  Bilateral renal cysts and too small further characterize hypodensities.  Otherwise symmetric renal enhancement.  No hydronephrosis or hydroureter.  There is a small bowel obstruction secondary to a small bowel containing hernia in the left lower quadrant. Proximal small bowel loops dilated up to 4.5 cm.  No free intraperitoneal air or fluid. No lymphadenopathy.  Normal caliber vasculature.  Thin-walled bladder.  Multilevel  degenerative changes of the imaged spine. No acute or aggressive appearing osseous lesion.  Leftward curvature of the spine.  IMPRESSION: Small bowel obstruction with transition at a small bowel containing left lower quadrant anterior abdominal wall hernia.  Original Report Authenticated By: Waneta Martins, M.D.    Review of Systems  Constitutional: Negative.   HENT: Negative.   Eyes: Negative.   Respiratory: Negative.   Cardiovascular: Negative.   Gastrointestinal: Positive for abdominal pain and constipation. Negative for heartburn, nausea, vomiting, diarrhea, blood in stool and melena.       2 day hx of abdominal distension, no BM's, no flatus  Genitourinary: Negative.   Musculoskeletal: Negative.   Skin: Negative.   Neurological: Negative.   Endo/Heme/Allergies: Negative.   Psychiatric/Behavioral: Negative.     Blood pressure 132/87, pulse 96, temperature 98.5 F (36.9 C), temperature source Oral, resp. rate 18, height 6' (1.829 m), weight 227 lb 11.2 oz (103.284 kg), SpO2 93.00%. Physical Exam  Constitutional: He is oriented to person, place, and time. He appears well-developed and well-nourished. No distress.  HENT:  Head: Normocephalic and atraumatic.  Right Ear: External ear normal.  Left Ear: External ear normal.  Nose: Nose normal.  Mouth/Throat: Oropharynx is clear and moist.  Eyes: Conjunctivae and EOM are normal. Pupils are equal, round, and reactive to light. No scleral icterus.  Neck: Normal range of motion. Neck supple. No tracheal deviation present. No thyromegaly present.  Cardiovascular: Normal rate, regular rhythm and normal heart sounds.   No murmur heard. Respiratory: Effort normal and breath sounds normal. No respiratory distress. He has no wheezes. He has no rales.  GI: Soft. Bowel sounds are normal. He exhibits distension. He exhibits no mass. There is tenderness. There is no rebound and no guarding.       Well healed incisions lower midline and LLQ  stoma site; soft without palpable mass; mild tenderness at stoma site; small fascial defect palpable below stoma scar  Musculoskeletal: Normal range of motion.  Lymphadenopathy:    He has no cervical adenopathy.  Neurological: He is alert and oriented to person, place, and time. He has normal reflexes.  Skin: Skin is warm and dry.  Psychiatric: He has a normal mood and affect. His behavior is normal. Judgment and thought content normal.     Assessment/Plan Small bowel obstruction secondary to incisional hernia  - NPO, IV hydration  - NG decompression  - will check AXR in AM 8/11 for improvement  - if small bowel distension improves with NG decompression and bowel rest, may be candidate for laparoscopic repair  - discussed with patient and family who agree with plan  Velora Heckler, MD, [facility] Office: [phone]    Ian Ritter Judie Petit  11/26/2011, 7:56 AM

## 2011-11-27 ENCOUNTER — Inpatient Hospital Stay (HOSPITAL_COMMUNITY): Payer: BC Managed Care – PPO

## 2011-11-27 LAB — BASIC METABOLIC PANEL
CO2: 24 mEq/L (ref 19–32)
Calcium: 9 mg/dL (ref 8.4–10.5)
Chloride: 103 mEq/L (ref 96–112)
Creatinine, Ser: 0.87 mg/dL (ref 0.50–1.35)
GFR calc Af Amer: >90 mL/min (ref >90)
Sodium: 138 mEq/L (ref 135–145)

## 2011-11-27 LAB — CBC
Platelets: 171 10*3/uL (ref 150–400)
RBC: 4.73 MIL/uL (ref 4.22–5.81)
RDW: 13.1 % (ref 11.5–15.5)
WBC: 4.1 10*3/uL (ref 4.0–10.5)

## 2011-11-27 NOTE — Progress Notes (Signed)
Patient ID: Ian Ritter, male   DOB: 1941/01/02, 71 y.o.   MRN: [id number]  General Surgery - [facility] - Progress Note  HD# 2  Subjective: Patient comfortable, less distended.  No flatus or BM.  No nausea or emesis.  Went to radiology for AXR.  NG still in place.  Objective: Vital signs in last 24 hours: Temp:  [98.6 F (37 C)-99 F (37.2 C)] 98.9 F (37.2 C) (08/11 0552) Pulse Rate:  [86-110] 110  (08/11 0552) Resp:  [18] 18  (08/11 0552) BP: (121-153)/(61-82) 135/82 mmHg (08/11 0552) SpO2:  [93 %-94 %] 93 % (08/11 0552) Last BM Date: 11/25/11  Intake/Output from previous day: 08/10 0701 - 08/11 0700 In: 1971.7 [I.V.:1881.7; NG/GT:90] Out: 1850 [Urine:1000; Emesis/NG output:600]  Exam: HEENT - clear, not icteric Neck - soft Chest - clear bilaterally Cor - RRR, no murmur Abd - mild distension; BS present, no flatus, no BM; hernia at stoma site reducible, minimally tender Ext - no significant edema Neuro - grossly intact, no focal deficits  Lab Results:   Basename 11/27/11 0435 11/25/11 2250 11/25/11 2236  WBC 4.1 -- 5.7  HGB 15.6 18.7* --  HCT 45.3 55.0* --  PLT 171 -- 222     Basename 11/27/11 0435 11/25/11 2250 11/25/11 2236  NA 138 142 --  K 4.0 4.0 --  CL 103 106 --  CO2 24 -- 24  GLUCOSE 142* 146* --  BUN 17 21 --  CREATININE 0.87 1.00 --  CALCIUM 9.0 -- 10.1    Studies/Results: Ct Abdomen Pelvis W Contrast  11/26/2011  *RADIOLOGY REPORT*  Clinical Data: Nausea, vomiting, abnormal radiograph  CT ABDOMEN AND PELVIS WITH CONTRAST  Technique:  Multidetector CT imaging of the abdomen and pelvis was performed following the standard protocol during bolus administration of intravenous contrast.  Contrast: OMNIPAQUE IOHEXOL 300 MG/ML  SOLN  Comparison: 11/25/2011 radiograph, 10/06/2004 CT  Findings: Bibasilar opacities.  No pleural effusion or pneumothorax.  Low attenuation of the liver suggests fatty infiltration.  There is a  calcification along the periphery the right lobe.  Subcentimeter hypodensity within the right lobe.  Unremarkable spleen, pancreas, biliary system, adrenal glands.  Bilateral renal cysts and too small further characterize hypodensities.  Otherwise symmetric renal enhancement.  No hydronephrosis or hydroureter.  There is a small bowel obstruction secondary to a small bowel containing hernia in the left lower quadrant. Proximal small bowel loops dilated up to 4.5 cm.  No free intraperitoneal air or fluid. No lymphadenopathy.  Normal caliber vasculature.  Thin-walled bladder.  Multilevel degenerative changes of the imaged spine. No acute or aggressive appearing osseous lesion.  Leftward curvature of the spine.  IMPRESSION: Small bowel obstruction with transition at a small bowel containing left lower quadrant anterior abdominal wall hernia.  Original Report Authenticated By: Waneta Martins, M.D.    Assessment / Plan: 1.  Incisional hernia at site of previous stoma, now reduced  - SBO resolving clinically  - AXR shows contrast now in ascending colon, small bowel distension markedly improved  - continue NG, NPO, IV hydration, ice chips  - plan operative repair of hernia tomorrow, open versus laparoscopic - will discuss with Dr. Merlene Morse, MD, [facility] Office: [phone]  11/27/2011

## 2011-11-28 ENCOUNTER — Inpatient Hospital Stay (HOSPITAL_COMMUNITY): Payer: BC Managed Care – PPO | Admitting: Anesthesiology

## 2011-11-28 ENCOUNTER — Encounter (HOSPITAL_COMMUNITY): Payer: Self-pay | Admitting: Anesthesiology

## 2011-11-28 ENCOUNTER — Encounter (HOSPITAL_COMMUNITY): Admission: EM | Disposition: A | Discharge status: Home / Self Care | Payer: Self-pay | Source: Home / Self Care

## 2011-11-28 DIAGNOSIS — K430 Incisional hernia with obstruction, without gangrene: Principal | ICD-10-CM | POA: Diagnosis present

## 2011-11-28 HISTORY — PX: VENTRAL HERNIA REPAIR: SHX424

## 2011-11-28 SURGERY — REPAIR, HERNIA, VENTRAL
Anesthesia: General | Wound class: Clean

## 2011-11-28 MED ORDER — ONDANSETRON HCL 4 MG/2ML IJ SOLN: 4.0000 mg | Freq: Four times a day (QID) | INTRAMUSCULAR | Status: DC | PRN

## 2011-11-28 MED ORDER — SODIUM CHLORIDE 0.9 % IJ SOLN: 9.0000 mL | INTRAMUSCULAR | Status: DC | PRN

## 2011-11-28 MED ORDER — HEPARIN SODIUM (PORCINE) 5000 UNIT/ML IJ SOLN
INTRAMUSCULAR | Status: AC
Start: 2011-11-28 — End: 2011-11-28
  Filled 2011-11-28: qty 1

## 2011-11-28 MED ORDER — HEPARIN SODIUM (PORCINE) 1000 UNIT/ML IJ SOLN
INTRAMUSCULAR | Status: DC | PRN
  Administered 2011-11-28: 5000 [IU] via INTRAVENOUS

## 2011-11-28 MED ORDER — HEPARIN SODIUM (PORCINE) 5000 UNIT/ML IJ SOLN: 5000.0000 [IU] | INTRAMUSCULAR | Status: DC

## 2011-11-28 MED ORDER — HEPARIN SODIUM (PORCINE) 5000 UNIT/ML IJ SOLN
5000.0000 [IU] | Freq: Three times a day (TID) | INTRAMUSCULAR | Status: DC
  Administered 2011-11-28 – 2011-12-03 (×14): 5000 [IU] via SUBCUTANEOUS
  Filled 2011-11-28 (×17): qty 1

## 2011-11-28 MED ORDER — LIDOCAINE HCL (CARDIAC) 20 MG/ML IV SOLN
INTRAVENOUS | Status: DC | PRN
  Administered 2011-11-28: 50 mg via INTRAVENOUS

## 2011-11-28 MED ORDER — LACTATED RINGERS IV SOLN
INTRAVENOUS | Status: DC | PRN
  Administered 2011-11-28 (×2): via INTRAVENOUS

## 2011-11-28 MED ORDER — HEPARIN SODIUM (PORCINE) 1000 UNIT/ML IJ SOLN: INTRAMUSCULAR | Status: DC | PRN

## 2011-11-28 MED ORDER — LACTATED RINGERS IV SOLN: INTRAVENOUS | Status: DC

## 2011-11-28 MED ORDER — CISATRACURIUM BESYLATE (PF) 10 MG/5ML IV SOLN
INTRAVENOUS | Status: DC | PRN
  Administered 2011-11-28: 6 mg via INTRAVENOUS
  Administered 2011-11-28 (×2): 2 mg via INTRAVENOUS

## 2011-11-28 MED ORDER — ONDANSETRON HCL 4 MG/2ML IJ SOLN
INTRAMUSCULAR | Status: DC | PRN
  Administered 2011-11-28 (×2): 2 mg via INTRAVENOUS

## 2011-11-28 MED ORDER — PHENOL 1.4 % MT LIQD: 1.0000 | OROMUCOSAL | Status: DC | PRN

## 2011-11-28 MED ORDER — MIDAZOLAM HCL 5 MG/5ML IJ SOLN
INTRAMUSCULAR | Status: DC | PRN
  Administered 2011-11-28 (×2): 1 mg via INTRAVENOUS

## 2011-11-28 MED ORDER — ACETAMINOPHEN 10 MG/ML IV SOLN
INTRAVENOUS | Status: DC | PRN
  Administered 2011-11-28: 1000 mg via INTRAVENOUS

## 2011-11-28 MED ORDER — GLYCOPYRROLATE 0.2 MG/ML IJ SOLN
INTRAMUSCULAR | Status: DC | PRN
  Administered 2011-11-28: 0.2 mg via INTRAVENOUS

## 2011-11-28 MED ORDER — LABETALOL HCL 5 MG/ML IV SOLN
INTRAVENOUS | Status: DC | PRN
  Administered 2011-11-28: 5 mg via INTRAVENOUS

## 2011-11-28 MED ORDER — NALOXONE HCL 0.4 MG/ML IJ SOLN: 0.4000 mg | INTRAMUSCULAR | Status: DC | PRN

## 2011-11-28 MED ORDER — BACITRACIN ZINC 500 UNIT/GM EX OINT
TOPICAL_OINTMENT | CUTANEOUS | Status: AC
  Filled 2011-11-28: qty 15

## 2011-11-28 MED ORDER — HYDROMORPHONE HCL PF 1 MG/ML IJ SOLN
INTRAMUSCULAR | Status: AC
  Filled 2011-11-28: qty 1

## 2011-11-28 MED ORDER — DEXAMETHASONE SODIUM PHOSPHATE 10 MG/ML IJ SOLN
INTRAMUSCULAR | Status: DC | PRN
  Administered 2011-11-28: 10 mg via INTRAVENOUS

## 2011-11-28 MED ORDER — PROPOFOL 10 MG/ML IV EMUL
INTRAVENOUS | Status: DC | PRN
  Administered 2011-11-28: 25 mg via INTRAVENOUS
  Administered 2011-11-28: 180 mg via INTRAVENOUS

## 2011-11-28 MED ORDER — NEOSTIGMINE METHYLSULFATE 1 MG/ML IJ SOLN
INTRAMUSCULAR | Status: DC | PRN
  Administered 2011-11-28: 1 mg via INTRAVENOUS

## 2011-11-28 MED ORDER — BACITRACIN ZINC 500 UNIT/GM EX OINT
TOPICAL_OINTMENT | CUTANEOUS | Status: DC | PRN
  Administered 2011-11-28: 1 via TOPICAL

## 2011-11-28 MED ORDER — MORPHINE SULFATE (PF) 1 MG/ML IV SOLN
INTRAVENOUS | Status: DC
  Administered 2011-11-28: 10.5 mg via INTRAVENOUS
  Administered 2011-11-28: 1 mg via INTRAVENOUS
  Administered 2011-11-29: 11:00:00 via INTRAVENOUS
  Administered 2011-11-29: 1.5 mg via INTRAVENOUS
  Administered 2011-11-29: 3 mg via INTRAVENOUS
  Administered 2011-11-30: 3.5 mg via INTRAVENOUS
  Administered 2011-11-30 (×2): 1.5 mg via INTRAVENOUS
  Administered 2011-11-30: 21:00:00 via INTRAVENOUS
  Administered 2011-11-30 (×3): 1.5 mg via INTRAVENOUS
  Filled 2011-11-28 (×2): qty 25

## 2011-11-28 MED ORDER — DIPHENHYDRAMINE HCL 50 MG/ML IJ SOLN: 12.5000 mg | Freq: Four times a day (QID) | INTRAMUSCULAR | Status: DC | PRN

## 2011-11-28 MED ORDER — SUCCINYLCHOLINE CHLORIDE 20 MG/ML IJ SOLN
INTRAMUSCULAR | Status: DC | PRN
  Administered 2011-11-28: 140 mg via INTRAVENOUS

## 2011-11-28 MED ORDER — MORPHINE SULFATE (PF) 1 MG/ML IV SOLN
INTRAVENOUS | Status: AC
  Filled 2011-11-28: qty 25

## 2011-11-28 MED ORDER — CEFAZOLIN SODIUM 1-5 GM-% IV SOLN
1.0000 g | Freq: Four times a day (QID) | INTRAVENOUS | Status: AC
  Administered 2011-11-28 – 2011-11-29 (×3): 1 g via INTRAVENOUS
  Filled 2011-11-28 (×3): qty 50

## 2011-11-28 MED ORDER — CEFAZOLIN SODIUM-DEXTROSE 2-3 GM-% IV SOLR
2.0000 g | INTRAVENOUS | Status: AC
  Administered 2011-11-28: 2 g via INTRAVENOUS
  Filled 2011-11-28: qty 50

## 2011-11-28 MED ORDER — FENTANYL CITRATE 0.05 MG/ML IJ SOLN
INTRAMUSCULAR | Status: DC | PRN
  Administered 2011-11-28: 50 ug via INTRAVENOUS
  Administered 2011-11-28: 100 ug via INTRAVENOUS
  Administered 2011-11-28 (×2): 50 ug via INTRAVENOUS

## 2011-11-28 MED ORDER — HYDROMORPHONE HCL PF 1 MG/ML IJ SOLN
0.2500 mg | INTRAMUSCULAR | Status: DC | PRN
  Administered 2011-11-28 (×3): 0.5 mg via INTRAVENOUS

## 2011-11-28 MED ORDER — DIPHENHYDRAMINE HCL 12.5 MG/5ML PO ELIX: 12.5000 mg | ORAL_SOLUTION | Freq: Four times a day (QID) | ORAL | Status: DC | PRN

## 2011-11-28 SURGICAL SUPPLY — 43 items
BINDER ABD UNIV 12 45-62 (WOUND CARE) IMPLANT
BINDER ABDOMINAL 46IN 62IN (WOUND CARE) ×4
BLADE HEX COATED 2.75 (ELECTRODE) ×2 IMPLANT
BRR ADH 6X5 SEPRAFILM 1 SHT (MISCELLANEOUS) ×1
CANISTER SUCTION 2500CC (MISCELLANEOUS) ×2 IMPLANT
CLOTH BEACON ORANGE TIMEOUT ST (SAFETY) ×2 IMPLANT
DECANTER SPIKE VIAL GLASS SM (MISCELLANEOUS) IMPLANT
DRAIN CHANNEL 19F RND (DRAIN) ×1 IMPLANT
DRAIN CHANNEL RND F F (WOUND CARE) IMPLANT
DRAPE LAPAROTOMY T 102X78X121 (DRAPES) ×2 IMPLANT
ELECT REM PT RETURN 9FT ADLT (ELECTROSURGICAL) ×2
ELECTRODE REM PT RTRN 9FT ADLT (ELECTROSURGICAL) ×1 IMPLANT
EVACUATOR SILICONE 100CC (DRAIN) ×1 IMPLANT
GLOVE BIOGEL PI IND STRL 7.0 (GLOVE) ×1 IMPLANT
GLOVE BIOGEL PI INDICATOR 7.0 (GLOVE) ×1
GLOVE EUDERMIC 7 POWDERFREE (GLOVE) ×2 IMPLANT
GOWN STRL NON-REIN LRG LVL3 (GOWN DISPOSABLE) ×3 IMPLANT
GOWN STRL REIN XL XLG (GOWN DISPOSABLE) ×4 IMPLANT
KIT BASIN OR (CUSTOM PROCEDURE TRAY) ×2 IMPLANT
MESH BARD SOFT 6X6IN (Mesh General) ×1 IMPLANT
NDL HYPO 25X1 1.5 SAFETY (NEEDLE) IMPLANT
NEEDLE HYPO 25X1 1.5 SAFETY (NEEDLE) IMPLANT
NS IRRIG 1000ML POUR BTL (IV SOLUTION) ×2 IMPLANT
PACK GENERAL/GYN (CUSTOM PROCEDURE TRAY) ×2 IMPLANT
SEPRAFILM MEMBRANE 5X6 (MISCELLANEOUS) ×1 IMPLANT
SPONGE GAUZE 4X4 12PLY (GAUZE/BANDAGES/DRESSINGS) ×1 IMPLANT
STAPLER VISISTAT 35W (STAPLE) ×2 IMPLANT
SUT ETHILON 3 0 PS 1 (SUTURE) ×1 IMPLANT
SUT NOVA 1 T20/GS 25DT (SUTURE) ×2 IMPLANT
SUT PROLENE 0 CT 1 CR/8 (SUTURE) ×2 IMPLANT
SUT PROLENE 2 0 CT2 30 (SUTURE) IMPLANT
SUT SILK 2 0 (SUTURE) ×2
SUT SILK 2 0 SH (SUTURE) IMPLANT
SUT SILK 2-0 18XBRD TIE 12 (SUTURE) IMPLANT
SUT SILK 3 0 (SUTURE) ×2
SUT SILK 3-0 18XBRD TIE 12 (SUTURE) IMPLANT
SUT SURG 0 T 19/GS 22 1969 62 (SUTURE) IMPLANT
SUT VIC AB 2-0 CT1 27 (SUTURE) ×2
SUT VIC AB 2-0 CT1 27XBRD (SUTURE) IMPLANT
SUT VICRYL RAPIDE 4/0 PS 2 (SUTURE) IMPLANT
SYR CONTROL 10ML LL (SYRINGE) IMPLANT
TOWEL OR 17X26 10 PK STRL BLUE (TOWEL DISPOSABLE) ×2 IMPLANT
TRAY FOLEY CATH 14FRSI W/METER (CATHETERS) ×1 IMPLANT

## 2011-11-28 NOTE — Progress Notes (Signed)
Incisional hernia with obstruction  Subjective: He feels a little bit better today. Not having much pain. He has not passed gas nor had a bowel movement.  Objective: Vital signs in last 24 hours: Temp:  [97.8 F (36.6 C)-98.5 F (36.9 C)] 98 F (36.7 C) (08/12 9604) Pulse Rate:  [90-92] 92  (08/12 0608) Resp:  [16-18] 18  (08/12 0608) BP: (124-137)/(36-71) 124/71 mmHg (08/12 0608) SpO2:  [92 %-96 %] 92 % (08/12 0608) Last BM Date: 11/25/11  Intake/Output from previous day: 08/11 0701 - 08/12 0700 In: 2451.7 [I.V.:2361.7; NG/GT:90] Out: 1250 [Urine:650; Emesis/NG output:600] Intake/Output this shift:    General appearance: alert, cooperative and no distress Resp: clear to auscultation bilaterally GI: his abdomen is soft and benign. He appears to have a hernia just to the right of the midline which is soft and nontender. There is another hernia in the left lower quadrant. I cannot feel the fascial defect as I think there small bowel out through the hernia now whereas it was reduced yesterday. It is not particularly tender. He has bowel sounds. There is no rebound. There is no guarding. There is no organomegaly.  Lab Results:  No results found for this or any previous visit (from the past 24 hour(s)).   Studies/Results Radiology     MEDS, Scheduled    . pantoprazole (PROTONIX) IV  40 mg Intravenous QHS     Assessment: Incisional hernia with obstruction I have reviewed his CT scan with the radiologist. He appears to have some fatty tissue through a midline hernia defect that is around the level of the umbilicus. There is a second defect in the left lower quadrant, apparently associated with the old colostomy site, that has small bowel and oriented and there was a point of obstruction. His plain films the next day appear to show contrast going through and this was after the hernia was reduced. However the hernias back out today.  Plan: I think we need to go ahead and  repair his hernia with mesh. Discussed that with the patient and his daughter. I told him that we will plan to use mesh. We are only going to address the left lower quadrant hernia today as this seems to be the one that symptomatic. I told him that we may end up with a much larger operation if he has multiple defects that I have to fix at the same time. We discussed the problems with infection. I've also talked about recurrences and that potential. Discussed potential for laparoscopic repair but I think this is going to be better approached open, especially the defect was small as Dr. Gerrit Friends Thought by physical examination. The defect was somewhat bigger on the CT scan however. I think all questions have been answered. I'm going to give him some heparin preoperatively as he appears to have some varicosities And has been in the hospital 2 days. We will leave his NG tube in. We'll give him some preoperative antibiotics.I told him that he may need to keep the NG tube postoperatively he should be here a week or so if we have to do an extensive repair or enterolysis.  LOS: 3 days    Currie Paris, MD, [facility], [place] [phone]   11/28/2011 9:15 AM

## 2011-11-28 NOTE — Anesthesia Procedure Notes (Addendum)
Procedure Name: Intubation Date/Time: 11/28/2011 10:46 AM Performed by: Edison Pace Pre-anesthesia Checklist: Patient identified, Timeout performed, Emergency Drugs available, Suction available and Patient being monitored Patient Re-evaluated:Patient Re-evaluated prior to inductionOxygen Delivery Method: Circle system utilized Preoxygenation: Pre-oxygenation with 100% oxygen Intubation Type: Cricoid Pressure applied, Rapid sequence and IV induction Laryngoscope Size: Mac and 4 Grade View: Grade II Tube type: Oral Tube size: 7.5 mm Number of attempts: 1 Airway Equipment and Method: Stylet Secured at: 21 cm Tube secured with: Tape Dental Injury: Teeth and Oropharynx as per pre-operative assessment    Performed by: Edison Pace

## 2011-11-28 NOTE — Anesthesia Preprocedure Evaluation (Addendum)
Anesthesia Evaluation  Patient identified by MRN, date of birth, ID band Patient awake    Reviewed: Allergy & Precautions, H&P , NPO status , Patient's Chart, lab work & pertinent test results, reviewed documented beta blocker date and time   Airway Mallampati: II TM Distance: >3 FB Neck ROM: full    Dental No notable dental hx. (+) Teeth Intact and Dental Advisory Given   Pulmonary neg pulmonary ROS,  breath sounds clear to auscultation  Pulmonary exam normal       Cardiovascular Exercise Tolerance: Good hypertension, Pt. on medications and Pt. on home beta blockers negative cardio ROS  Rhythm:regular Rate:Normal     Neuro/Psych negative neurological ROS  negative psych ROS   GI/Hepatic negative GI ROS, Neg liver ROS,   Endo/Other  negative endocrine ROS  Renal/GU negative Renal ROS  negative genitourinary   Musculoskeletal   Abdominal   Peds  Hematology negative hematology ROS (+)   Anesthesia Other Findings   Reproductive/Obstetrics negative OB ROS                           Anesthesia Physical Anesthesia Plan  ASA: II and Emergent  Anesthesia Plan: General   Post-op Pain Management:    Induction: Intravenous, Rapid sequence and Cricoid pressure planned  Airway Management Planned: Oral ETT  Additional Equipment:   Intra-op Plan:   Post-operative Plan:   Informed Consent: I have reviewed the patients History and Physical, chart, labs and discussed the procedure including the risks, benefits and alternatives for the proposed anesthesia with the patient or authorized representative who has indicated his/her understanding and acceptance.   Dental Advisory Given  Plan Discussed with: CRNA and Surgeon  Anesthesia Plan Comments:        Anesthesia Quick Evaluation

## 2011-11-28 NOTE — Transfer of Care (Signed)
Immediate Anesthesia Transfer of Care Note  Patient: Ian Ritter  Procedure(s) Performed: Procedure(s) (LRB): HERNIA REPAIR VENTRAL ADULT (N/A)  Patient Location: PACU  Anesthesia Type: General  Level of Consciousness: awake, oriented, patient cooperative, lethargic and responds to stimulation  Airway & Oxygen Therapy: Patient Spontanous Breathing and Patient connected to face mask oxygen  Post-op Assessment: Report given to PACU RN, Post -op Vital signs reviewed and stable and Patient moving all extremities  Post vital signs: Reviewed and stable  Complications: No apparent anesthesia complications

## 2011-11-28 NOTE — Preoperative (Signed)
Beta Blockers   Reason not to administer Beta Blockers:Not Applicable labetalol given on emergence

## 2011-11-28 NOTE — Op Note (Signed)
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 It appeared partially reduced by physical but he continued to have signs of obstruction and today the hernia was back protruding out. We therefore elected to proceed to repair. He was also noted to have what appeared to be some midline hernias containing only fat. We elected to leave these alone for the present time and try to fix the area that was causing the current problem.   Description of Procedure: I saw the patient again the preoperative area and confirmed the plans. He was taken to the operating room. After satisfactory general endotracheal anesthesia was obtained a Foley catheter was placed and the abdomen was prepped and draped. A time out procedure was done.  I excised the old transversely oriented colostomy scar. Almost immediately identified a hernia sac which had incarcerated omentum and was primarily below the incision. I spent several minutes getting the omentum out of the hernia sac because it was densely adherent. I had to resect a little bit of the omentum because it was friable and bleeding. At the time of my incision his small bowel had reduced back into the abdominal cavity.  Once I had the omentum out of the hernia sac was able to reduce it. There are multiple omental adhesions to the peritoneum and fascial edges which were all freed up. I made sure I had no omentum stuck to the anterior abdominal wall. Needle side.  It was well freed up.  I then also elevated skin flaps just anterior to the fascia in all directions so I could put a wide piece of mesh over the repair. The defect itself measured about 6 cm. It appeared that it would close more readily vertically then horizontally as he was less tension. I put some material and to try to reduce any adhesions and then closed the fascia with interrupted #1 Novafil suture. I left several of themTied but on cut. I then took a 6 inch piece of soft Bard mesh. I anchored it to the fascia at the very top of the closure going about 3 cm above the highest suture. I then threaded each of the repair sutures through the mesh and tied those down anchoring the mesh to the repair. I made a long lateral incision in the anterior fascia to relax the closure. I then laid the mesh over the anterior fascia going well beyond the relaxing incision, well inferior and well superior to the closure and then medial as far as the midline. It was all anchored with 0 Prolene sutures.  Prior to closing I was able to palpate a small defect or 2 in the midline away from where we were working but elected, as planned preoperatively, to leave these alone for now. After the mesh had been sutured down I excised a large sac from the subcutaneous tissue. I irrigated and made sure everything was dry. I put a 19 Blake drain into the subcutaneous tissues and secured with a 2-0 nylon. I irrigated again. I closed in layers with some 2-0 Vicryl  running on the subcutaneous and staples on the skin.  Patient part of the procedure well. There are no operative complications. Counts were correct. Estimated blood loss was about 50 cc.  Currie Paris, MD, FACS 11/28/2011 12:38 PM

## 2011-11-28 NOTE — Care Management Note (Signed)
    Page 1 of 1   12/02/2011     12:54:53 PM   CARE MANAGEMENT NOTE 12/02/2011  Patient:  Ian Ritter, Ian Ritter   Account Number:  [id number]  Date Initiated:  11/28/2011  Documentation initiated by:  Lorenda Ishihara  Subjective/Objective Assessment:   71 yo male admitted with SBO, likely incarcerated hernia. PTA lived at home with spouse.     Action/Plan:   Anticipated DC Date:  12/01/2011   Anticipated DC Plan:  HOME/SELF CARE      DC Planning Services  CM consult      Choice offered to / List presented to:             Status of service:  Completed, signed off Medicare Important Message given?   (If response is "NO", the following Medicare IM given date fields will be blank) Date Medicare IM given:   Date Additional Medicare IM given:    Discharge Disposition:  HOME/SELF CARE  Per UR Regulation:  Reviewed for med. necessity/level of care/duration of stay  If discussed at Long Length of Stay Meetings, dates discussed:    Comments:

## 2011-11-28 NOTE — Anesthesia Postprocedure Evaluation (Signed)
  Anesthesia Post-op Note  Patient: Ian Ritter  Procedure(s) Performed: Procedure(s) (LRB): HERNIA REPAIR VENTRAL ADULT (N/A)  Patient Location: PACU  Anesthesia Type: General  Level of Consciousness: awake and alert   Airway and Oxygen Therapy: Patient Spontanous Breathing  Post-op Pain: mild  Post-op Assessment: Post-op Vital signs reviewed, Patient's Cardiovascular Status Stable, Respiratory Function Stable, Patent Airway and No signs of Nausea or vomiting  Post-op Vital Signs: stable  Complications: No apparent anesthesia complications

## 2011-11-29 ENCOUNTER — Encounter (HOSPITAL_COMMUNITY): Payer: Self-pay | Admitting: Surgery

## 2011-11-29 LAB — CBC
MCH: 32.4 pg (ref 26.0–34.0)
MCHC: 33.9 g/dL (ref 30.0–36.0)
MCV: 95.7 fL (ref 78.0–100.0)
Platelets: 156 10*3/uL (ref 150–400)
RDW: 12.6 % (ref 11.5–15.5)
WBC: 6.2 10*3/uL (ref 4.0–10.5)

## 2011-11-29 LAB — BASIC METABOLIC PANEL
BUN: 11 mg/dL (ref 6–23)
Calcium: 8.4 mg/dL (ref 8.4–10.5)
Creatinine, Ser: 0.9 mg/dL (ref 0.50–1.35)
GFR calc Af Amer: >90 mL/min (ref >90)

## 2011-11-29 MED ORDER — METOPROLOL TARTRATE 25 MG PO TABS
25.0000 mg | ORAL_TABLET | Freq: Two times a day (BID) | ORAL | Status: DC
  Administered 2011-11-29 – 2011-12-03 (×9): 25 mg via ORAL
  Filled 2011-11-29 (×10): qty 1

## 2011-11-29 NOTE — Progress Notes (Signed)
1 Day Post-Op  Subjective: He feels better today than yesterday. Not having any significant pain. I passed any gas yet. Has been out of bed and ambulated.  Objective: Vital signs in last 24 hours: Temp:  [97.9 F (36.6 C)-99 F (37.2 C)] 97.9 F (36.6 C) (08/13 0530) Pulse Rate:  [65-88] 70  (08/13 0530) Resp:  [10-16] 11  (08/13 0738) BP: (113-142)/(64-84) 117/73 mmHg (08/13 0530) SpO2:  [93 %-100 %] 94 % (08/13 0738)   Intake/Output from previous day: 08/12 0701 - 08/13 0700 In: 2600 [I.V.:2500; IV Piggyback:100] Out: 2350 [Urine:2110; Drains:65; Blood:75] Intake/Output this shift:     General appearance: alert and no distress Resp: clear to auscultation bilaterally GI: abdomen is soft, dressing dry. Nontender.  Incision: JP fluid is thin.  Lab Results:   Basename 11/29/11 0410 2011-12-09 0435  WBC 6.2 4.1  HGB 12.9* 15.6  HCT 38.1* 45.3  PLT 156 171   BMET  Basename 11/29/11 0410 2011-12-09 0435  NA 137 138  K 4.4 4.0  CL 102 103  CO2 28 24  GLUCOSE 130* 142*  BUN 11 17  CREATININE 0.90 0.87  CALCIUM 8.4 9.0   PT/INR No results found for this basename: LABPROT:2,INR:2 in the last 72 hours ABG No results found for this basename: PHART:2,PCO2:2,PO2:2,HCO3:2 in the last 72 hours  MEDS, Scheduled    .  ceFAZolin (ANCEF) IV  1 g Intravenous Q6H  .  ceFAZolin (ANCEF) IV  2 g Intravenous 60 min Pre-Op  . heparin  5,000 Units Subcutaneous Q8H  . HYDROmorphone      . HYDROmorphone      . morphine   Intravenous Q4H  . morphine      . pantoprazole (PROTONIX) IV  40 mg Intravenous QHS  . DISCONTD: heparin  5,000 Units Subcutaneous 60 min Pre-Op    Studies/Results: Dg Abd 2 Views  12-09-2011  *RADIOLOGY REPORT*  Clinical Data: Abdominal pain, distension, follow up small bowel obstruction  ABDOMEN - 2 VIEW  Comparison: CT abdomen pelvis dated 11/26/2011  Findings: Multiple dilated loops of small bowel in the central abdomen, compatible with small bowel  obstruction.  However, residual contrast is present in the colon, indicating that it is only partially obstructive.  No evidence of free air under the diaphragm on the upright view.  Enteric tube looped in the stomach and terminating in the gastric cardia.  Degenerative changes with thoracolumbar scoliosis.  IMPRESSION: Stable dilated loops of small bowel in the central abdomen, compatible with high-grade partial small bowel obstruction.  No evidence of free air.  Enteric tube looped in the stomach and terminating in the gastric cardia.  Original Report Authenticated By: Charline Bills, M.D.    Assessment: s/p Procedure(s): HERNIA REPAIR VENTRAL ADULT Patient Active Problem List  Diagnosis  . Incisional hernia with obstruction    Stable one day postop  Plan: we'll continue ice chips only until he passes gas. Foley catheter was removed this morning. Will restart his metoprolol today. Encouraged ambulation but he is already doing that.   LOS: 4 days     Currie Paris, MD, [facility], [place] [phone]   11/29/2011 7:47 AM

## 2011-11-29 NOTE — Op Note (Addendum)
Note in wrong chart.

## 2011-11-29 NOTE — Clinical Documentation Improvement (Signed)
Abnormal Labs Clarification  THIS DOCUMENT IS NOT A PERMANENT PART OF THE MEDICAL RECORD  TO RESPOND TO THE THIS QUERY, FOLLOW THE INSTRUCTIONS BELOW:  1. If needed, update documentation for the patient's encounter via the notes activity.  2. Access this query again and click edit on the [employer].  3. After updating, or not, click F2 to complete all highlighted (required) fields concerning your review. Select "additional documentation in the medical record" OR "no additional documentation provided".  4. Click Sign note button.  5. The deficiency will fall out of your InBasket *Please let us know if you are not able to complete this workflow by phone or e-mail (listed below).  Please update your documentation within the medical record to reflect your response to this query.                                                                                   11/29/11  Dear Dr. Jamey Ripa Marton Redwood  In a better effort to capture your patient's severity of illness, reflect appropriate length of stay and utilization of resources, a review of the medical record has revealed the following indicators.    Based on your clinical judgment, please clarify and document in a progress note and/or discharge summary the clinical condition associated with the following supporting information:  In responding to this query please exercise your independent judgment.  The fact that a query is asked, does not imply that any particular answer is desired or expected.  Abnormal findings (laboratory, x-ray, pathologic, and other diagnostic results) are not coded and reported unless the physician indicates their clinical significance.   The medical record reflects the following clinical findings, please clarify the diagnostic and/or clinical significance:      Please clarify/validate on progress notes or d/c summary the underlying  causes, if any, for the abnormal labs listed below. Thank you.  acute  posthemorrhagic anemia  acute blood loss anemia  - Other Condition (please specify) - Cannot Clinically Determine Risk factor: Hernia repair  Lab Test: 11/29/11         11/27/11    11/25/11     11/24/11  RBC  3.98 (L)    4.73              5.44         4.11 (L)                   Hgb  12.9 (L)    15.6             18.7 (H)     17.6 (H)                     HCT  38.1 (L)    45.3              55.0 (H)    51.7   Treatment:  D5 1/2 NS  with KCL 30 mEq @100ml  /hr CBC     Reviewed: additional documentation in the medical record Patient does not have anemia, acute blood loss or otherwise   Thank You,  Andy Gauss RN  Clinical Documentation Specialist:  Pager #  [phone] E-mail [email or web address]   Health Information Management [place]

## 2011-11-30 LAB — BASIC METABOLIC PANEL
BUN: 11 mg/dL (ref 6–23)
CO2: 24 mEq/L (ref 19–32)
Calcium: 8.7 mg/dL (ref 8.4–10.5)
GFR calc non Af Amer: 84 mL/min — ABNORMAL LOW (ref >90)
Glucose, Bld: 103 mg/dL — ABNORMAL HIGH (ref 70–99)
Sodium: 138 mEq/L (ref 135–145)

## 2011-11-30 LAB — CBC
MCH: 32.1 pg (ref 26.0–34.0)
MCHC: 33.4 g/dL (ref 30.0–36.0)
MCV: 96 fL (ref 78.0–100.0)
Platelets: 189 10*3/uL (ref 150–400)
RBC: 4.27 MIL/uL (ref 4.22–5.81)

## 2011-11-30 MED ORDER — BISACODYL 10 MG RE SUPP
10.0000 mg | Freq: Once | RECTAL | Status: AC
  Administered 2011-11-30: 10 mg via RECTAL
  Filled 2011-11-30: qty 1

## 2011-11-30 NOTE — Progress Notes (Signed)
2 Days Post-Op  Subjective: Ambulating the halls. Not much pain. Not passed gas or had a bowel movement yet. No nausea with NG out x2 days postop  Objective: Vital signs in last 24 hours: Temp:  [98.3 F (36.8 C)-99 F (37.2 C)] 98.3 F (36.8 C) (08/14 0600) Pulse Rate:  [78-85] 78  (08/14 0600) Resp:  [12-17] 14  (08/14 0822) BP: (116-130)/(63-78) 116/71 mmHg (08/14 0600) SpO2:  [93 %-96 %] 96 % (08/14 0822)   Intake/Output from previous day: 08/13 0701 - 08/14 0700 In: 1615 [I.V.:1605; IV Piggyback:10] Out: 1750 [Urine:1700; Drains:50] Intake/Output this shift: Total I/O In: 400 [I.V.:400] Out: -    General appearance: alert and no distress Resp: clear to auscultation bilaterally GI: still a little distended but soft. Tender at the incision. Occasional bowel sounds are heard.  Incision: The dressing is dry, the JP is thin.  Lab Results:   Basename 11/30/11 0330 11/29/11 0410  WBC 4.9 6.2  HGB 13.7 12.9*  HCT 41.0 38.1*  PLT 189 156   BMET  Basename 11/30/11 0330 11/29/11 0410  NA 138 137  K 3.7 4.4  CL 104 102  CO2 24 28  GLUCOSE 103* 130*  BUN 11 11  CREATININE 0.90 0.90  CALCIUM 8.7 8.4   PT/INR No results found for this basename: LABPROT:2,INR:2 in the last 72 hours ABG No results found for this basename: PHART:2,PCO2:2,PO2:2,HCO3:2 in the last 72 hours  MEDS, Scheduled    . heparin  5,000 Units Subcutaneous Q8H  . metoprolol  25 mg Oral BID  . morphine   Intravenous Q4H  . pantoprazole (PROTONIX) IV  40 mg Intravenous QHS    Studies/Results: No results found.  Assessment: s/p Procedure(s): HERNIA REPAIR VENTRAL ADULT Patient Active Problem List  Diagnosis  . Incisional hernia with obstruction    Slow progress.Hemoglobin is normal. The change from admission represents volume repletion as he was fairly "dry "at admission  Plan: Will allow sips of liquids today. We'll also give Dulcolax suppository.    LOS: 5 days     Currie Paris, MD, [facility], [place] [phone]   11/30/2011 10:25 AM

## 2011-12-01 MED ORDER — OXYCODONE-ACETAMINOPHEN 5-325 MG PO TABS: 1.0000 | ORAL_TABLET | ORAL | Status: DC | PRN

## 2011-12-01 NOTE — Progress Notes (Signed)
Ambulating in halls, no issues - hasn't really passed gas but denies abd discomfort or pain. Will see if he tolerates more PO today

## 2011-12-01 NOTE — Progress Notes (Signed)
Patient ID: Ian Ritter, male   DOB: [birth year], 71 y.o.   MRN: [id number] 3 Days Post-Op  Subjective: Pt feels well.  Just sore with movement.  Had small BM yesterday with suppository.  Tolerating clear liquids with no issues.  Objective: Vital signs in last 24 hours: Temp:  [98.1 F (36.7 C)] 98.1 F (36.7 C) (08/14 1400) Pulse Rate:  [69-77] 77  (08/14 2230) Resp:  [12-20] 18  (08/15 0724) BP: (130-132)/(60-74) 130/60 mmHg (08/14 2230) SpO2:  [95 %-97 %] 96 % (08/15 0724) Last BM Date: 11/30/11  Intake/Output from previous day: 08/14 0701 - 08/15 0700 In: 2400 [I.V.:2400] Out: 3055 [Urine:3025; Drains:30] Intake/Output this shift:    PE: Abd: soft, +BS, tender appropriately, ND, incision is c/d/i, JP with minimal serous output.  Lab Results:   Basename 11/30/11 0330 11/29/11 0410  WBC 4.9 6.2  HGB 13.7 12.9*  HCT 41.0 38.1*  PLT 189 156   BMET  Basename 11/30/11 0330 11/29/11 0410  NA 138 137  K 3.7 4.4  CL 104 102  CO2 24 28  GLUCOSE 103* 130*  BUN 11 11  CREATININE 0.90 0.90  CALCIUM 8.7 8.4   PT/INR No results found for this basename: LABPROT:2,INR:2 in the last 72 hours CMP     Component Value Date/Time   NA 138 11/30/2011 0330   K 3.7 11/30/2011 0330   CL 104 11/30/2011 0330   CO2 24 11/30/2011 0330   GLUCOSE 103* 11/30/2011 0330   BUN 11 11/30/2011 0330   CREATININE 0.90 11/30/2011 0330   CALCIUM 8.7 11/30/2011 0330   PROT 7.4 11/25/2011 2236   ALBUMIN 4.2 11/25/2011 2236   AST 22 11/25/2011 2236   ALT 25 11/25/2011 2236   ALKPHOS 85 11/25/2011 2236   BILITOT 1.1 11/25/2011 2236   GFRNONAA 84* 11/30/2011 0330   GFRAA >90 11/30/2011 0330   Lipase     Component Value Date/Time   LIPASE 12 11/25/2011 2236       Studies/Results: No results found.  Anti-infectives: Anti-infectives     Start     Dose/Rate Route Frequency Ordered Stop   11/28/11 1600   ceFAZolin (ANCEF) IVPB 1 g/50 mL premix        1 g 100 mL/hr over 30 Minutes Intravenous Every 6  hours 11/28/11 1450 11/29/11 0434   11/28/11 1045   ceFAZolin (ANCEF) IVPB 2 g/50 mL premix        2 g 100 mL/hr over 30 Minutes Intravenous 60 min pre-op 11/28/11 1017 11/28/11 1030           Assessment/Plan  1. S/p incisional hernia repair  Plan: 1. Advance to full liquids 2. Dc PCA, start oral percocet 3. Likely dc JP prior to dc.   LOS: 6 days    Thomasene Dubow E 12/01/2011

## 2011-12-02 ENCOUNTER — Telehealth (INDEPENDENT_AMBULATORY_CARE_PROVIDER_SITE_OTHER): Payer: Self-pay

## 2011-12-02 NOTE — Progress Notes (Signed)
Looks really good today and should be able to go home tomorrow. Tolerating diet, having BM's, loose as expected. Abd is benign

## 2011-12-02 NOTE — Discharge Summary (Deleted)
Patient ID:  Ian Ritter  MRN: [id number]  DOB/AGE: 04/23/1940 71 y.o.  Admit date: 11/25/2011  Discharge date: 12-03-2011 Procedures: open incisional hernia repair with primary closure and overlay mesh  Consults: None  Reason for Admission: this is a 71 yo male who had a colostomy years ago. He presented to the [facility] secondary to abdominal pain and SBO secondary to an incarcerated hernia at this site.  Admission Diagnoses:  1. Incarcerated incisional hernia with SBO  Hospital Course:  The patient was admitted. He was taken to the operating room where he underwent an incisional hernia repair. He tolerated this procedure well. He had an NGT postoperatively and a mild ileus. This resolved and his NGT was able to be dc on POD# 2. He was started on clear liquids and his diet was advanced as tolerated. He will likely have his JP drain removed prior to dc and go home tomorrow 12-03-11 if tolerates his regular diet well.  Discharge Diagnoses:  Principal Problem:  *Incisional hernia with obstruction  s/p open incision hernia repair  Discharge Medications:  Medication List  As of 12/02/2011 10:32 AM    ASK your doctor about these medications          GAS-X 80 MG chewable tablet      Generic drug: simethicone      Chew 80 mg by mouth every 6 (six) hours as needed.      lisinopril 20 MG tablet      Commonly known as: PRINIVIL,ZESTRIL      Take 20 mg by mouth daily.      metoprolol 50 MG tablet      Commonly known as: LOPRESSOR      Take 50 mg by mouth 2 (two) times daily.      multivitamin tablet      Take 1 tablet by mouth daily.      senna 8.6 MG tablet      Commonly known as: SENOKOT      Take 1 tablet by mouth as needed.         Discharge Instructions:  Follow up with Dr. Tenna Child RN in 1 week for staple removal.  Follow up with Dr. Jamey Ripa in 2-3 weeks for a postoperative visit   Signed:  Letha Cape

## 2011-12-02 NOTE — Progress Notes (Signed)
Patient ID: Ian Ritter, male   DOB: 08/26/1940, 71 y.o.   MRN: [id number] 4 Days Post-Op  Subjective: Pt feels well today.  Passing flatus.  Tolerating full liquids well.  Objective: Vital signs in last 24 hours: Temp:  [97.7 F (36.5 C)-98.7 F (37.1 C)] 98.7 F (37.1 C) (08/16 0600) Pulse Rate:  [70-83] 70  (08/16 0600) Resp:  [16] 16  (08/16 0600) BP: (118-130)/(64-81) 121/64 mmHg (08/16 0600) SpO2:  [92 %-99 %] 99 % (08/16 0600) Last BM Date: 12/02/11  Intake/Output from previous day: 08/15 0701 - 08/16 0700 In: 2691.7 [P.O.:300; I.V.:2391.7] Out: 860 [Urine:800; Drains:60] Intake/Output this shift:    PE: Abd: soft, minimally tender, ND, +BS, JP drain with serous output, wound covered  Lab Results:   Basename 11/30/11 0330  WBC 4.9  HGB 13.7  HCT 41.0  PLT 189   BMET  Basename 11/30/11 0330  NA 138  K 3.7  CL 104  CO2 24  GLUCOSE 103*  BUN 11  CREATININE 0.90  CALCIUM 8.7   PT/INR No results found for this basename: LABPROT:2,INR:2 in the last 72 hours CMP     Component Value Date/Time   NA 138 11/30/2011 0330   K 3.7 11/30/2011 0330   CL 104 11/30/2011 0330   CO2 24 11/30/2011 0330   GLUCOSE 103* 11/30/2011 0330   BUN 11 11/30/2011 0330   CREATININE 0.90 11/30/2011 0330   CALCIUM 8.7 11/30/2011 0330   PROT 7.4 11/25/2011 2236   ALBUMIN 4.2 11/25/2011 2236   AST 22 11/25/2011 2236   ALT 25 11/25/2011 2236   ALKPHOS 85 11/25/2011 2236   BILITOT 1.1 11/25/2011 2236   GFRNONAA 84* 11/30/2011 0330   GFRAA >90 11/30/2011 0330   Lipase     Component Value Date/Time   LIPASE 12 11/25/2011 2236       Studies/Results: No results found.  Anti-infectives: Anti-infectives     Start     Dose/Rate Route Frequency Ordered Stop   11/28/11 1600   ceFAZolin (ANCEF) IVPB 1 g/50 mL premix        1 g 100 mL/hr over 30 Minutes Intravenous Every 6 hours 11/28/11 1450 11/29/11 0434   11/28/11 1045   ceFAZolin (ANCEF) IVPB 2 g/50 mL premix        2 g 100 mL/hr over  30 Minutes Intravenous 60 min pre-op 11/28/11 1017 11/28/11 1030           Assessment/Plan  1. S/p incisional hernia repair  Plan: 1. Advance diet to regular 2. Anticipate dc home tomorrow 3. Dc JP today or tomorrow?   LOS: 7 days    Ian Ritter E 12/02/2011

## 2011-12-02 NOTE — Discharge Summary (Signed)
Patient ID: Ian Ritter MRN: [id number] DOB/AGE: November 08, 1940 71 y.o.  Admit date: 11/25/2011 Discharge date: 12/03/2011  Procedures: open incisional hernia repair with primary closure and overlay mesh  Consults: None  Reason for Admission: this is a 71 yo male who had a colostomy years ago.  He presented to the [facility] secondary to abdominal pain and SBO secondary to an incarcerated hernia at this site.  Admission Diagnoses:  1. Incarcerated incisional hernia with SBO   Hospital Course:  The patient was admitted.  He was taken to the operating room where he underwent an incisional hernia repair.  He tolerated this procedure well.  He had an NGT postoperatively and a mild ileus.  This resolved and his NGT was able to be dc on POD# 2.  He was started on clear liquids and his diet was advanced as tolerated.   The patient mobilized and advanced to a solid diet gradually.  Pain was well-controlled and transitioned off IV medications.   By the time of discharge, the patient was walking well the hallways, eating food well, having flatus.  Pain was-controlled on an oral regimen.  Based on meeting DC criteria and recovering well, I felt it was safe for the patient to be discharged home with close followup.  Instructions were discussed in detail.  They are written as well.  Discharge Diagnoses:  Principal Problem:  *Incisional hernia with obstruction s/p open incision hernia repair  Discharge Medications: Medication List  As of 12/02/2011 10:32 AM   ASK your doctor about these medications         GAS-X 80 MG chewable tablet   Generic drug: simethicone   Chew 80 mg by mouth every 6 (six) hours as needed.      lisinopril 20 MG tablet   Commonly known as: PRINIVIL,ZESTRIL   Take 20 mg by mouth daily.      metoprolol 50 MG tablet   Commonly known as: LOPRESSOR   Take 50 mg by mouth 2 (two) times daily.      multivitamin tablet   Take 1 tablet by mouth daily.      senna 8.6 MG tablet   Commonly known as: SENOKOT   Take 1 tablet by mouth as needed.            Discharge Instructions: Follow up with Dr. Tenna Child RN in 1 week for staple removal. Follow up with Dr. Jamey Ripa in 2-3 weeks for a postoperative visit  Signed: OSBORNE,KELLY E 12/02/2011, 10:32 AM

## 2011-12-02 NOTE — Telephone Encounter (Signed)
Per Tresa Endo PA appt made for nurse only staple removal on 12-08-11 at 1:40pm.

## 2011-12-03 MED ORDER — OXYCODONE HCL 5 MG PO TABS: 5.0000 mg | ORAL_TABLET | ORAL | Status: AC | PRN

## 2011-12-03 NOTE — Progress Notes (Signed)
Patient discharged via wheelchair. Prescription given for oxycodone. Patient states understanding of discharge instructions and empties own drain

## 2011-12-05 ENCOUNTER — Telehealth (INDEPENDENT_AMBULATORY_CARE_PROVIDER_SITE_OTHER): Payer: Self-pay | Admitting: General Surgery

## 2011-12-05 NOTE — Telephone Encounter (Signed)
Message copied by Littie Deeds on Mon Dec 05, 2011  8:56 AM ------      Message from: Zacarias Pontes      Created: Mon Dec 05, 2011  8:47 AM       Pt needs 2 week po apt.. pls call [phone]

## 2011-12-05 NOTE — Telephone Encounter (Signed)
Spoke with pt and let him know that his first PO appt w/ Dr. Jamey Ripa will be on 9/4 at 4:20.  He was okay with this.  I also sent a reminder card in the mail.

## 2011-12-08 ENCOUNTER — Ambulatory Visit (INDEPENDENT_AMBULATORY_CARE_PROVIDER_SITE_OTHER): Payer: BC Managed Care – PPO

## 2011-12-08 DIAGNOSIS — K430 Incisional hernia with obstruction, without gangrene: Secondary | ICD-10-CM

## 2011-12-08 NOTE — Progress Notes (Signed)
Pt is here today for staple and drain removal post op open hernia repair.  The incision is healing well.  Staples were removed and steri strips placed.  The drain was removed and opening covered with dry gauze.  The patient is doing very well with no problems or concerns.  He is scheduled for his post op with Dr. Jamey Ripa.

## 2011-12-21 ENCOUNTER — Encounter (INDEPENDENT_AMBULATORY_CARE_PROVIDER_SITE_OTHER): Payer: Self-pay | Admitting: Surgery

## 2011-12-21 ENCOUNTER — Ambulatory Visit (INDEPENDENT_AMBULATORY_CARE_PROVIDER_SITE_OTHER): Payer: BC Managed Care – PPO | Admitting: Surgery

## 2011-12-21 VITALS — BP 120/80 | HR 68 | Temp 97.9°F | Resp 16 | Ht 69.0 in | Wt 221.0 lb

## 2011-12-21 DIAGNOSIS — Z09 Encounter for follow-up examination after completed treatment for conditions other than malignant neoplasm: Secondary | ICD-10-CM

## 2011-12-21 NOTE — Progress Notes (Signed)
NAME: Ian Ritter                                            DOB: 1940/10/26 DATE: 12/21/2011                                                  MRN: [id number]  CC: Post op   HPI: This patient comes in for post op follow-up.Heunderwent repair of an incarcerated incisional hernia with partial SBO  on 11/28/11. He feels that he is doing well.  PE:  VITAL SIGNS: BP 120/80  Pulse 68  Temp 97.9 F (36.6 C) (Temporal)  Resp 16  Ht 5\' 9"  (1.753 m)  Wt 221 lb (100.245 kg)  BMI 32.64 kg/m2  General: The patient appears to be healthy, NAD Abd is benign and the repair is intact. He has another smaller midline hernia that will need to be addressed at some point.   IMPRESSION: The patient is doing well S/P repair of incisonal hernia in LLQ colostomey site.    PLAN: RTC three months

## 2011-12-21 NOTE — Patient Instructions (Signed)
See me again in three months to re-evaluate the abdominal hernias.

## 2012-03-27 ENCOUNTER — Ambulatory Visit (INDEPENDENT_AMBULATORY_CARE_PROVIDER_SITE_OTHER): Payer: BC Managed Care – PPO | Admitting: Surgery

## 2012-04-26 ENCOUNTER — Encounter (INDEPENDENT_AMBULATORY_CARE_PROVIDER_SITE_OTHER): Payer: Self-pay | Admitting: Surgery

## 2012-04-26 ENCOUNTER — Ambulatory Visit (INDEPENDENT_AMBULATORY_CARE_PROVIDER_SITE_OTHER): Payer: BC Managed Care – PPO | Admitting: Surgery

## 2012-04-26 VITALS — BP 160/82 | HR 60 | Temp 95.2°F | Resp 16 | Ht 72.0 in | Wt 226.8 lb

## 2012-04-26 DIAGNOSIS — K432 Incisional hernia without obstruction or gangrene: Secondary | ICD-10-CM

## 2012-04-26 NOTE — Progress Notes (Signed)
NAME: Ian Ritter                                            DOB: November 22, 1940 DATE: 04/26/2012                                                  MRN: [id number]  CC: Post op   HPI: This patient comes in for follow-up.Heunderwent repair of an incarcerated incisional hernia with partial SBO  on 11/28/11. He feels that he is doing well.he was aware that there was a residual midline incisional hernia that was not addressed at the time of his initial surgery. He has noticed no change in that. He is having no symptoms of pain or any signs or symptoms of obstruction. He came back today to followup and discuss possible repair. He believes that the repair that we did at the left lower quadrant colostomy site has remained intact.  PE:  VITAL SIGNS: BP 160/82  Pulse 60  Temp 95.2 F (35.1 C) (Temporal)  Resp 16  Ht 6' (1.829 m)  Wt 226 lb 12.8 oz (102.876 kg)  BMI 30.76 kg/m2  General: The patient appears to be healthy, NAD Abd is benign and the repair is intact. He has had midline hernia that reduces easily I think this contained some omentum. Is difficult to ascertain the exact size and whether he has some additional defects in the lower midline and appear physical exam.   IMPRESSION: The patient is doing well S/P repair of incisonal hernia in LLQ colostomy site.no evidence of recurrence at that site Persistent lower midline ventral incisional hernia, currently asymptomatic and unchanged from his last visit    PLAN: I had a long discussion with the patient about operative repairs. I did recommend a preoperative CT scan to assess the exact size of the hernia. I think he would be a candidate to attempt a laparoscopic repair. On over the risks and complications of repair, use of mesh, options of laparoscopic and open repairs. I told him I did not think there was an immediate need for surgery since he is asymptomatic in this hernia has remained stable. I told him I was concerned over time get bigger he  does run some point some risk of needing more urgent surgery.  He will think over his options. If he decides he is interested in surgery he will call back and we will obtain a CT scan. I also told him I would refer him to my associates it does laparoscopic repairs for evaluation after the CT is done.

## 2012-04-26 NOTE — Patient Instructions (Signed)
If you decide to have the hernia repaired it me a call so we can make the appropriate arrangements.

## 2013-05-08 IMAGING — CT CT ABD-PELV W/ CM
1 of 3 series · 14 of 32 positions shown, 19 images · IV contrast (OMNIPAQUE 300)
Comparison: 11/25/2011 radiograph, 10/06/2004 CT]

CLINICAL DATA: Nausea, vomiting, abnormal radiograph

CT ABDOMEN AND PELVIS WITH CONTRAST
TECHNIQUE: Multidetector CT imaging of the abdomen and pelvis was
performed following the standard protocol during bolus
administration of intravenous contrast.
Contrast: 100mL OMNIPAQUE IOHEXOL 300 MG/ML  SOLN

[Series 2: abd/pel with · axial · 0.89mm/px · z∈[-521,-81]mm · 14 of 100 slices shown, 19 images]
[im 6/100  soft-tissue]
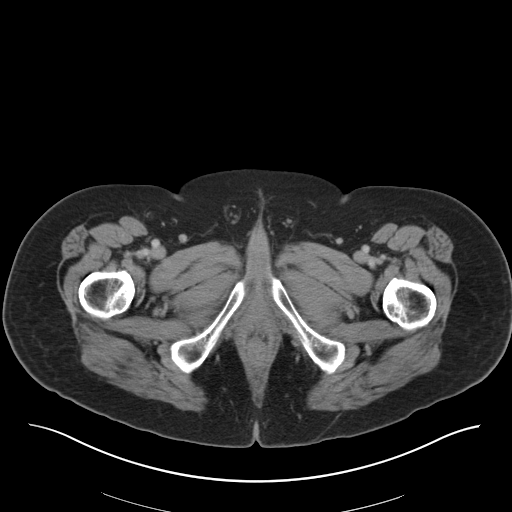
[im 6/100  bone]
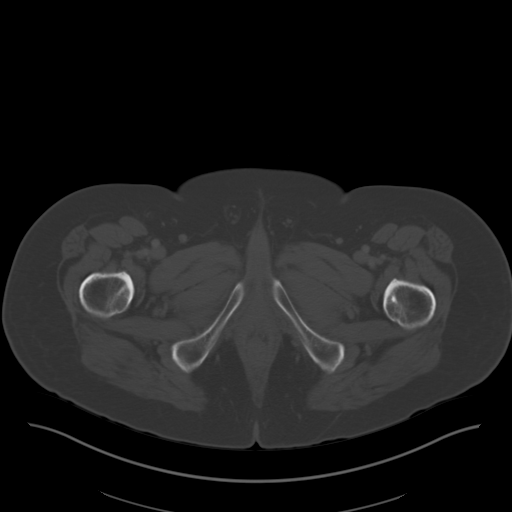
[im 16/100  soft-tissue]
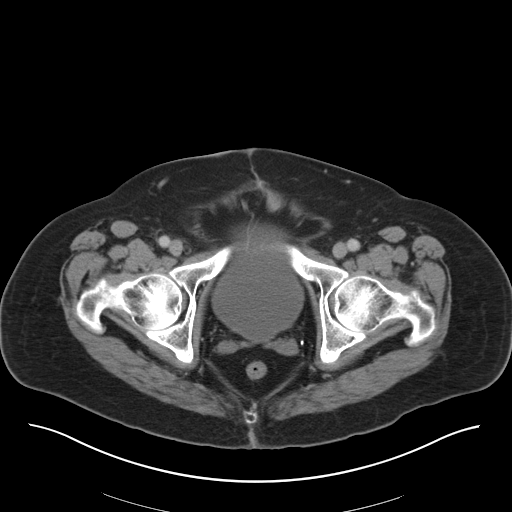
[im 21/100  soft-tissue]
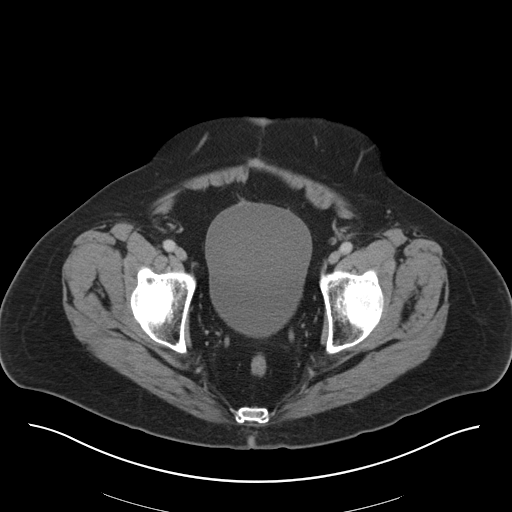
[im 27/100  soft-tissue]
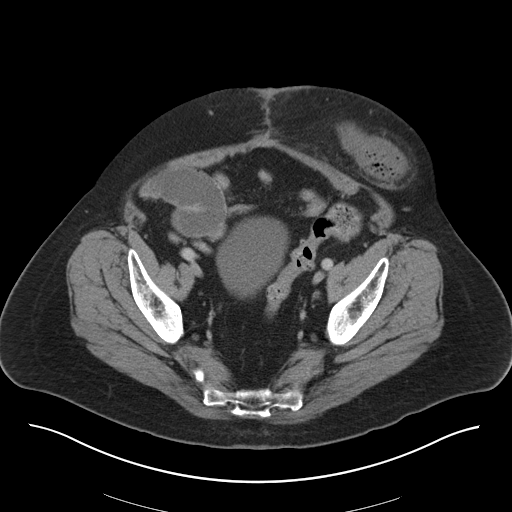
[im 37/100  soft-tissue]
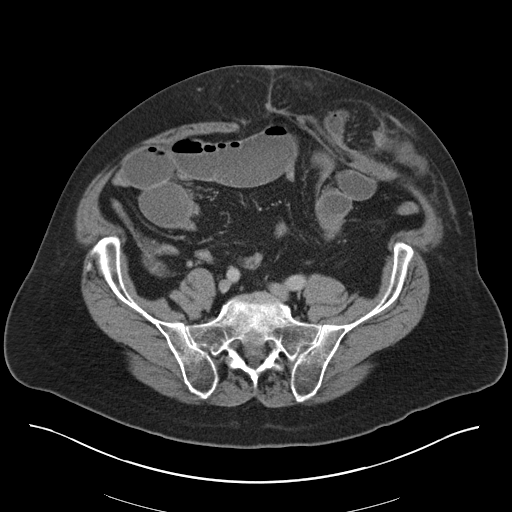
[im 42/100  soft-tissue]
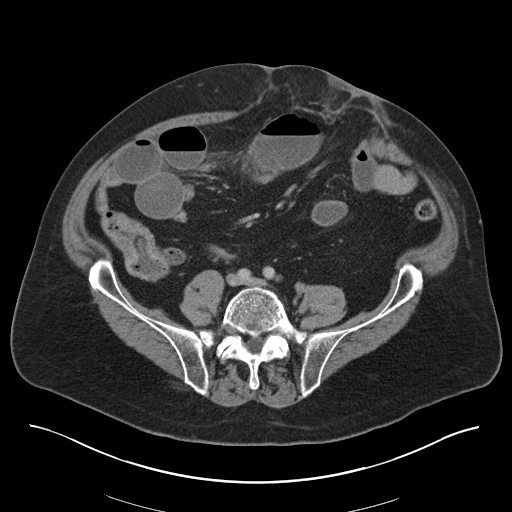
[im 53/100  soft-tissue]
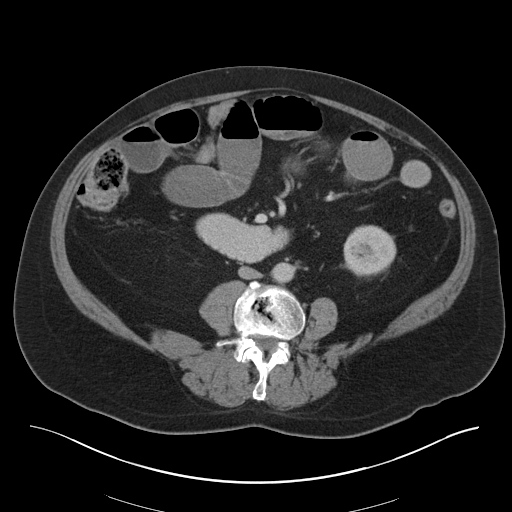
[im 58/100  soft-tissue]
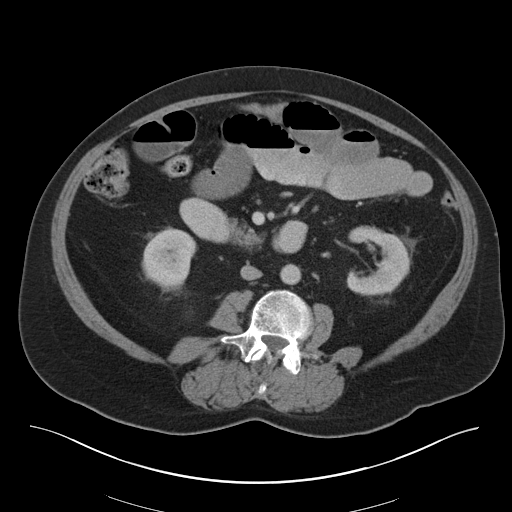
[im 63/100  soft-tissue]
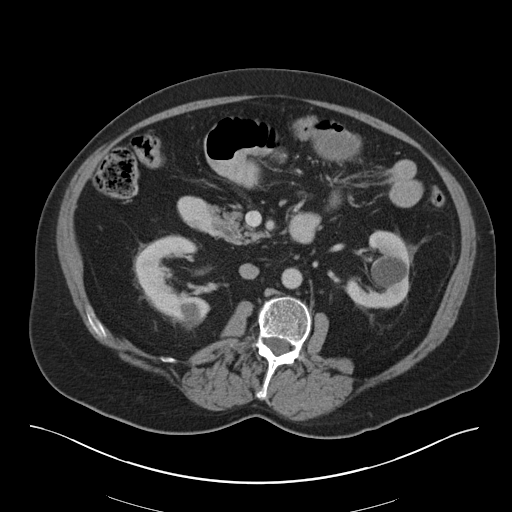
[im 63/100  bone]
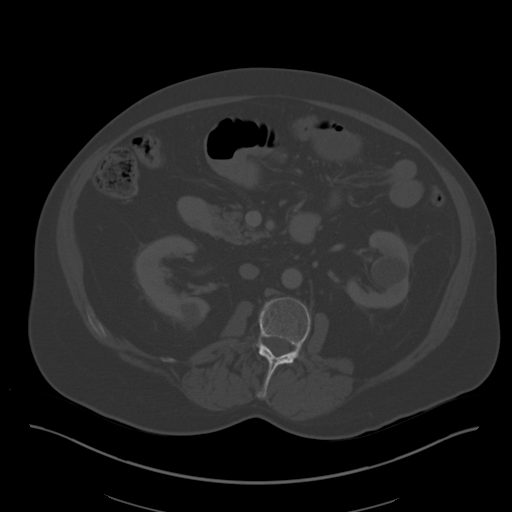
[im 73/100  soft-tissue]
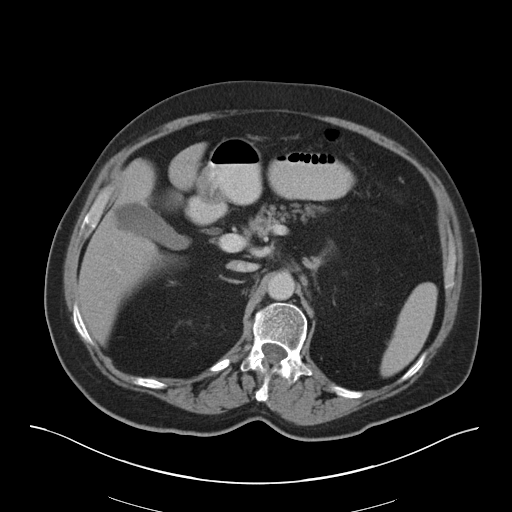
[im 79/100  soft-tissue]
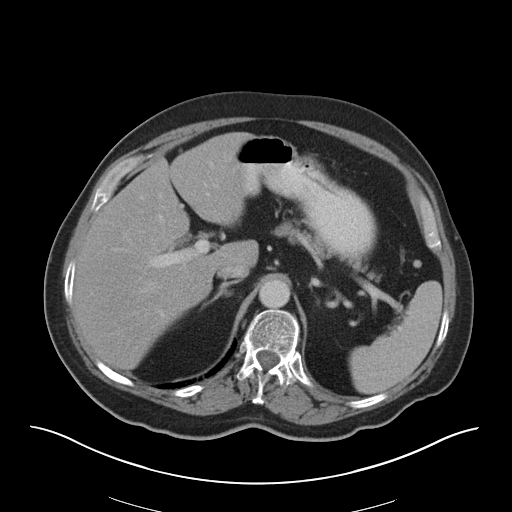
[im 79/100  lung]
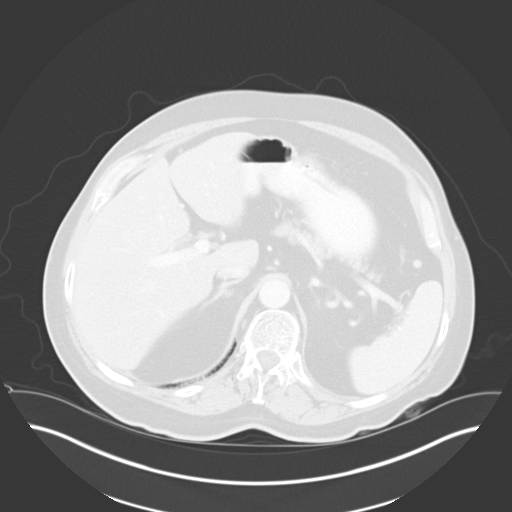
[im 84/100  soft-tissue]
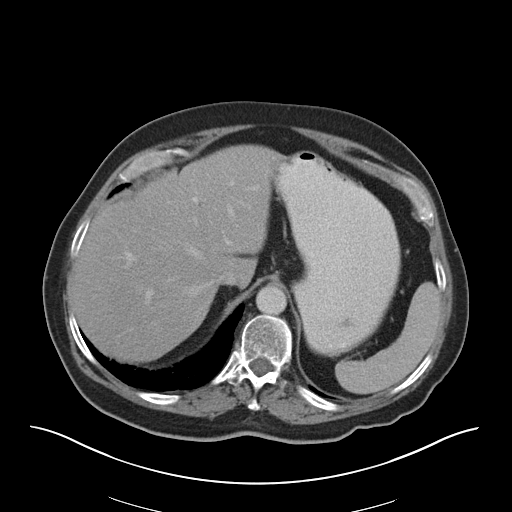
[im 84/100  lung]
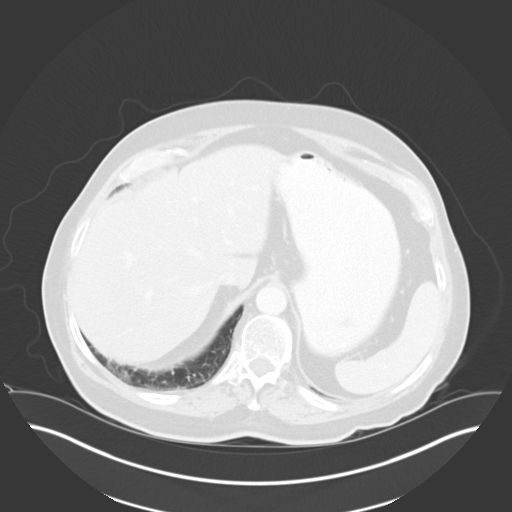
[im 89/100  lung]
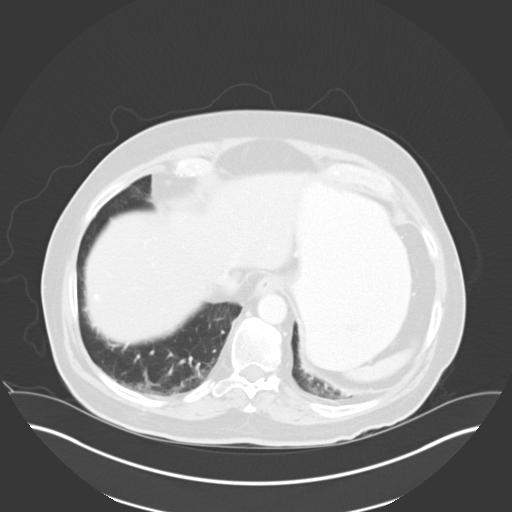
[im 94/100  soft-tissue]
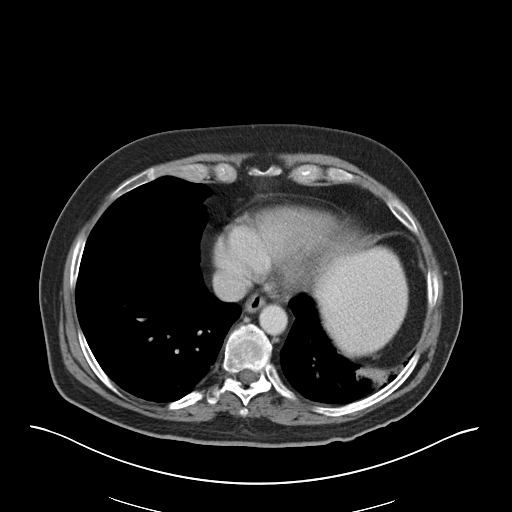
[im 94/100  lung]
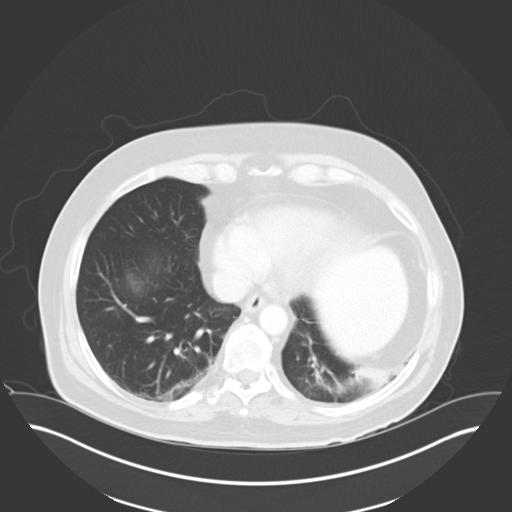

[14 of 32 positions shown; findings below may reference images not displayed]

FINDINGS: Bibasilar opacities.  No pleural effusion or
pneumothorax.

Low attenuation of the liver suggests fatty infiltration.  There is
a calcification along the periphery the right lobe.  Subcentimeter
hypodensity within the right lobe.  Unremarkable spleen, pancreas,
biliary system, adrenal glands.

Bilateral renal cysts and too small further characterize
hypodensities.  Otherwise symmetric renal enhancement.  No
hydronephrosis or hydroureter.

There is a small bowel obstruction secondary to a small bowel
containing hernia in the left lower quadrant. Proximal small bowel
loops dilated up to 4.5 cm.  No free intraperitoneal air or fluid.
No lymphadenopathy.

Normal caliber vasculature.

Thin-walled bladder.

Multilevel degenerative changes of the imaged spine. No acute or
aggressive appearing osseous lesion.  Leftward curvature of the
spine.
IMPRESSION: Small bowel obstruction with transition at a small bowel containing
left lower quadrant anterior abdominal wall hernia.

## 2013-05-09 IMAGING — CR DG ABDOMEN 2V
3 series · 3 of 3 positions shown · non-contrast
Comparison: CT abdomen pelvis dated 11/26/2011

CLINICAL DATA: Abdominal pain, distension, follow up small bowel
obstruction

ABDOMEN - 2 VIEW

[w abdomen upright *]
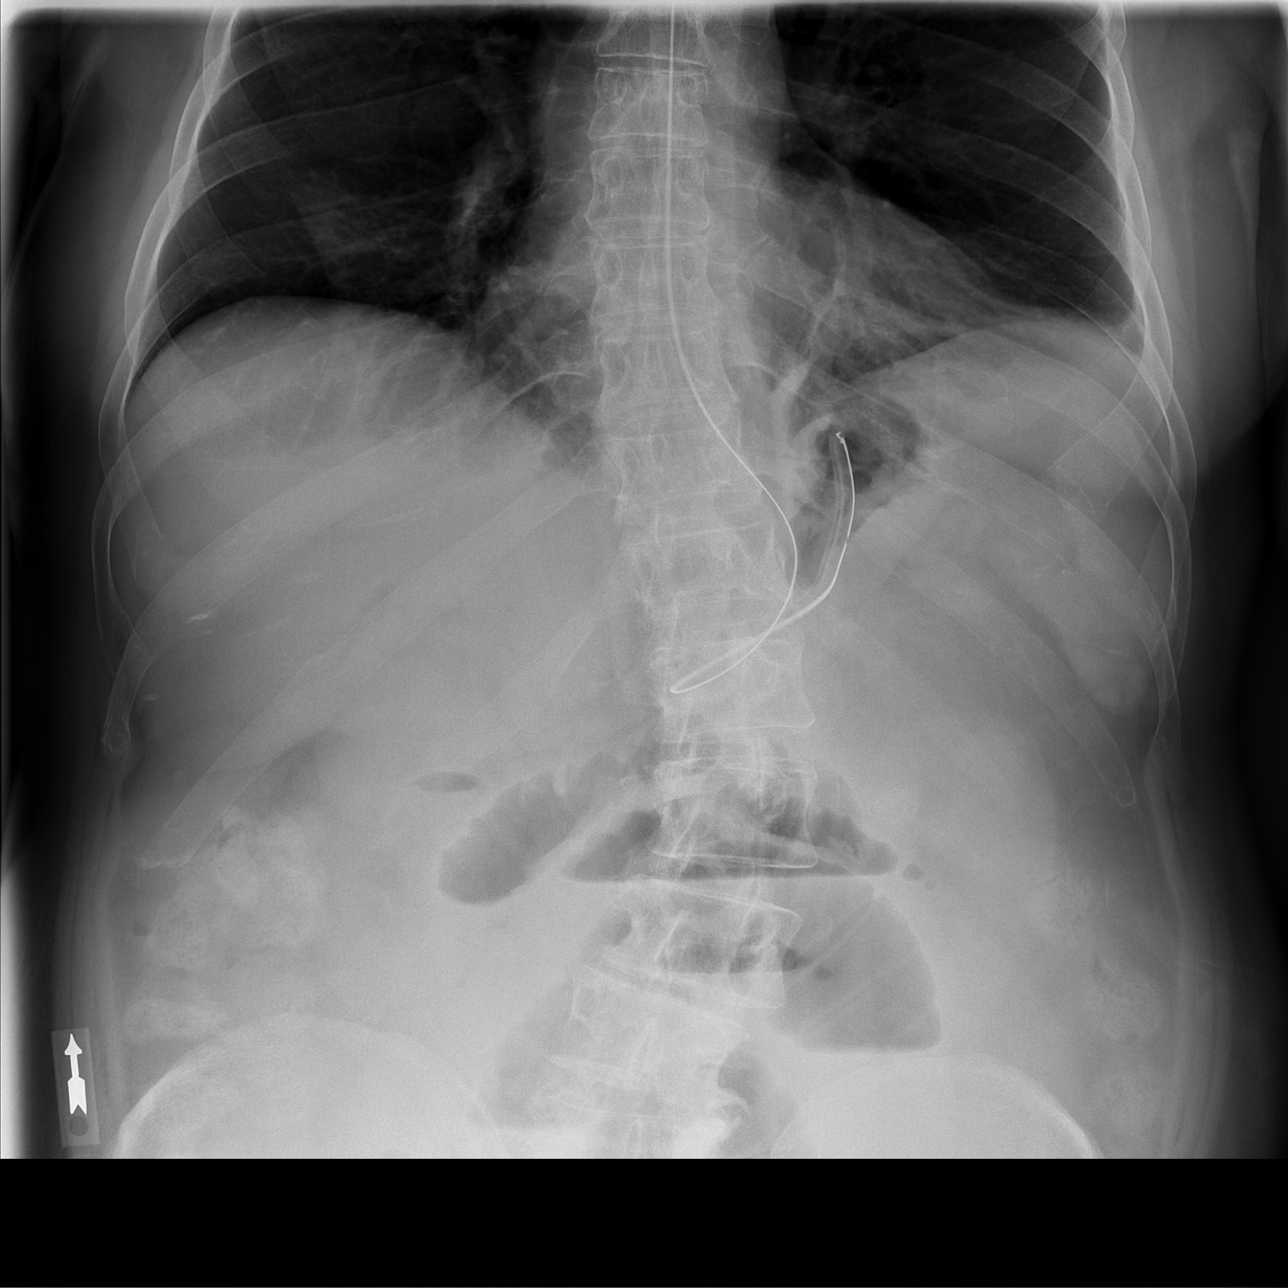

[t abdomen supine (1 of 2)]
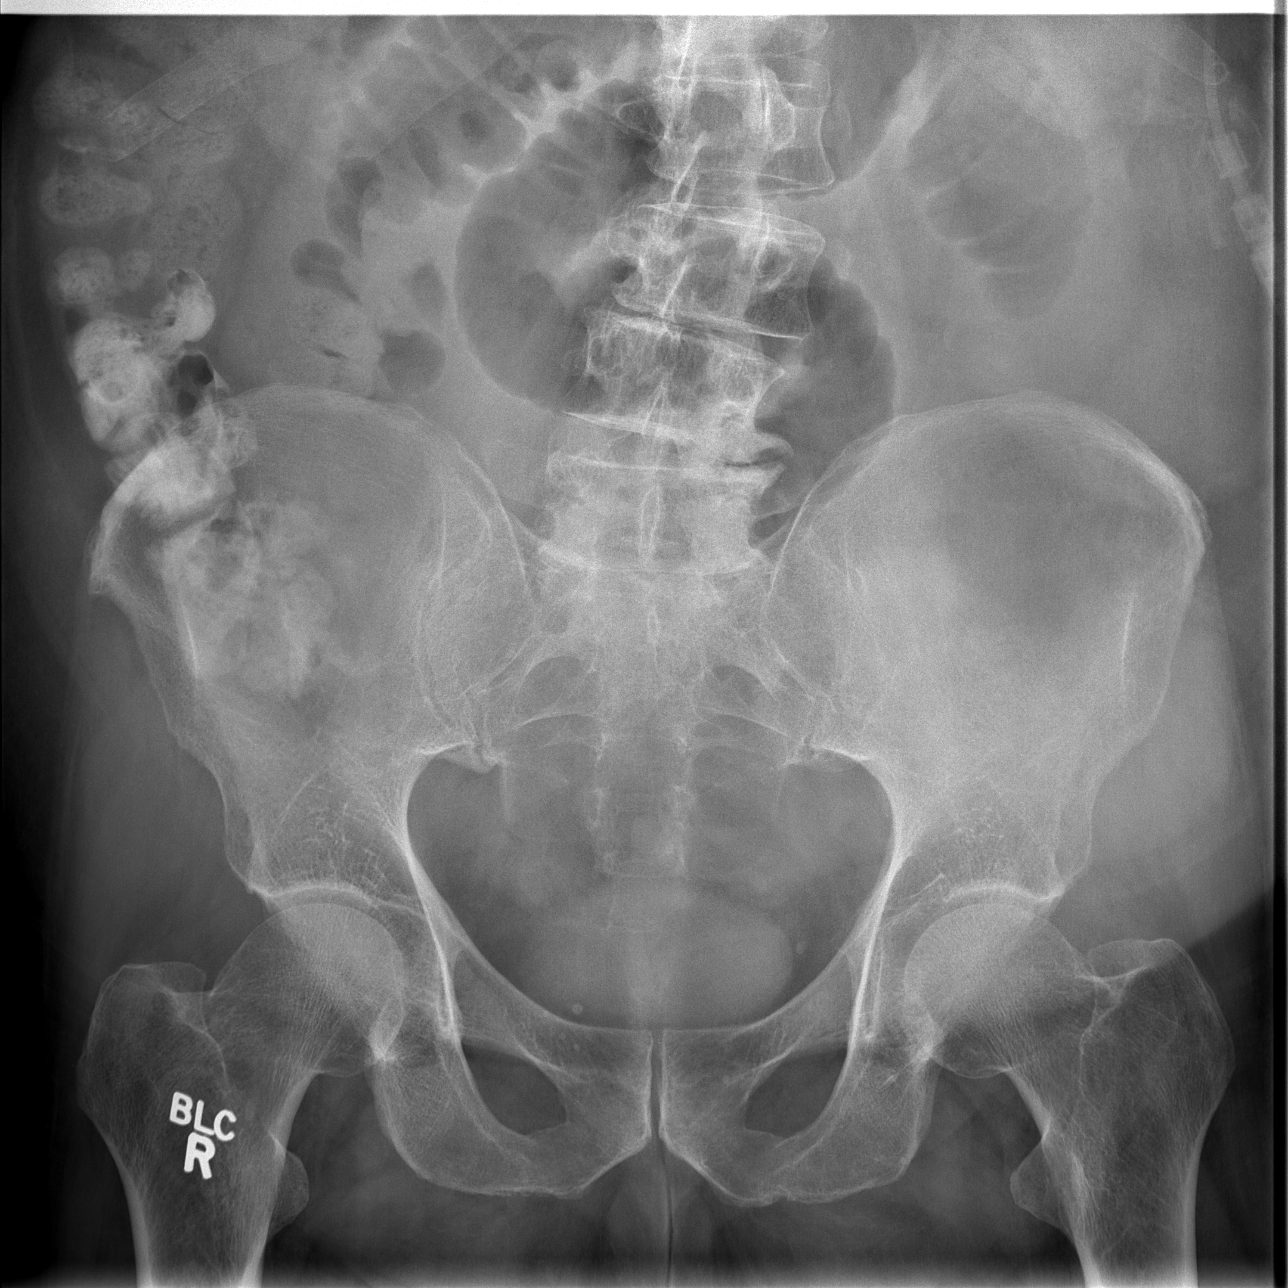

[t abdomen supine (2 of 2)]
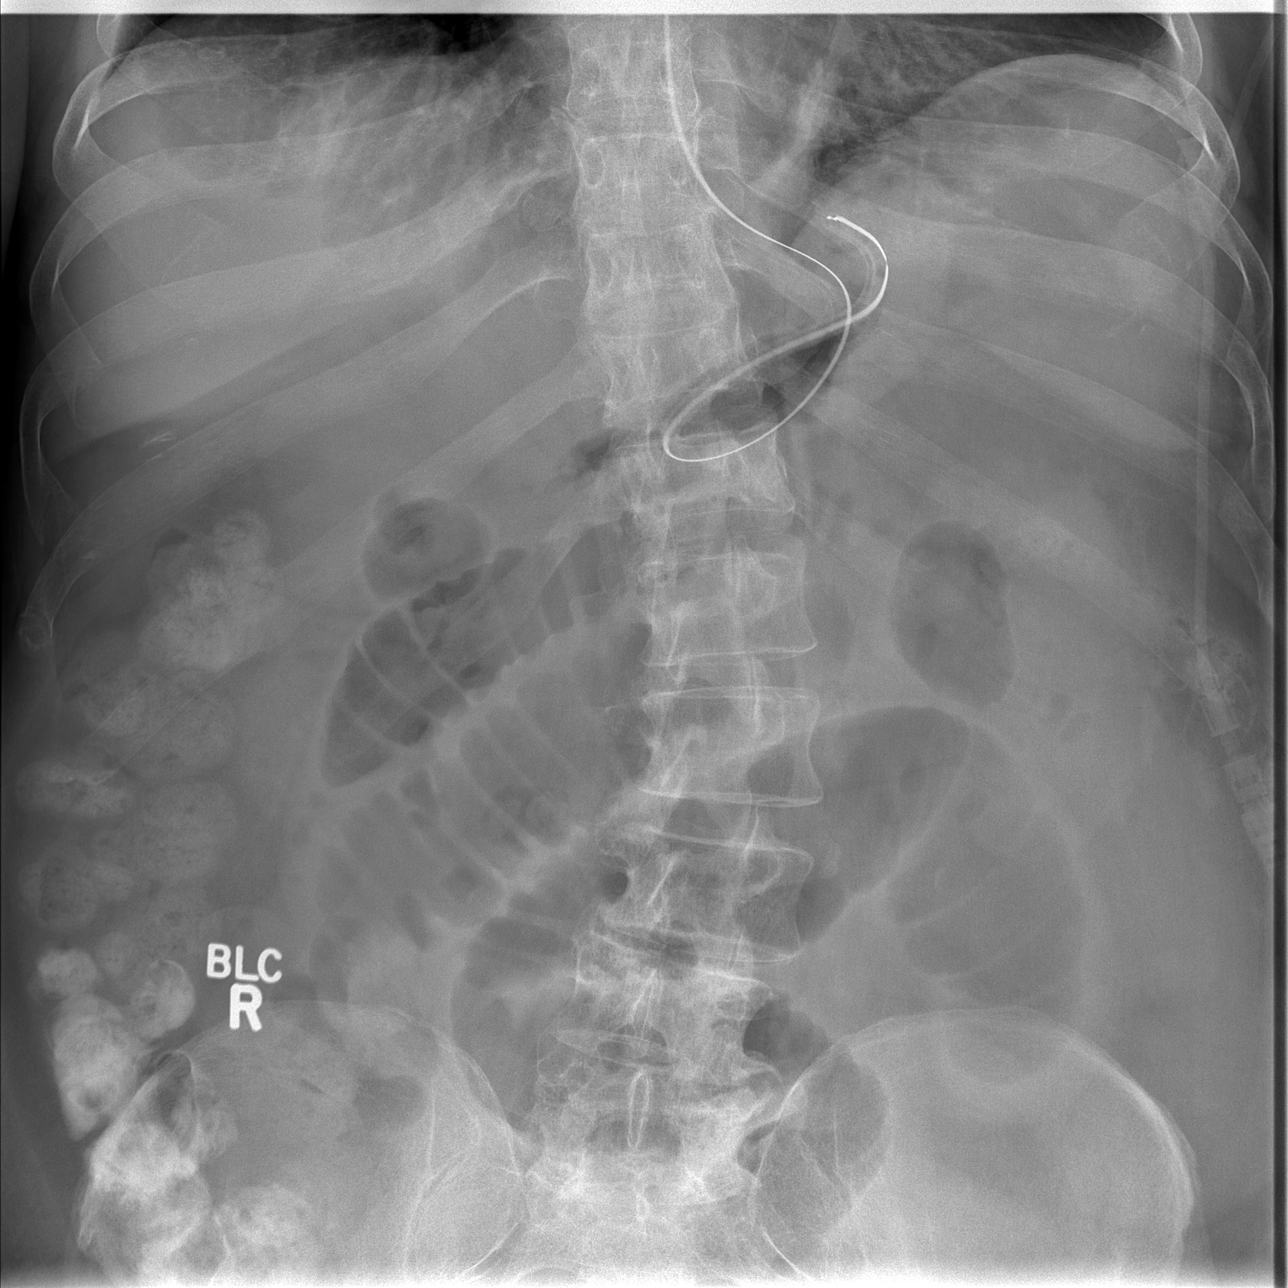

[3 of 3 positions shown; findings below may reference images not displayed]

FINDINGS: Multiple dilated loops of small bowel in the central
abdomen, compatible with small bowel obstruction.  However,
residual contrast is present in the colon, indicating that it is
only partially obstructive.

No evidence of free air under the diaphragm on the upright view.

Enteric tube looped in the stomach and terminating in the gastric
cardia.

Degenerative changes with thoracolumbar scoliosis.
IMPRESSION: Stable dilated loops of small bowel in the central abdomen,
compatible with high-grade partial small bowel obstruction.

No evidence of free air.

Enteric tube looped in the stomach and terminating in the gastric
cardia.

## 2014-04-23 DIAGNOSIS — H3531 Nonexudative age-related macular degeneration: Secondary | ICD-10-CM | POA: Diagnosis not present

## 2014-04-23 DIAGNOSIS — H43813 Vitreous degeneration, bilateral: Secondary | ICD-10-CM | POA: Diagnosis not present

## 2014-12-15 ENCOUNTER — Other Ambulatory Visit: Payer: Self-pay | Admitting: Dermatology

## 2015-04-30 DIAGNOSIS — Z85828 Personal history of other malignant neoplasm of skin: Secondary | ICD-10-CM | POA: Diagnosis not present

## 2016-01-01 DIAGNOSIS — Z23 Encounter for immunization: Secondary | ICD-10-CM | POA: Diagnosis not present

## 2016-04-28 ENCOUNTER — Other Ambulatory Visit: Payer: Self-pay | Admitting: Dermatology

## 2016-04-28 DIAGNOSIS — D1801 Hemangioma of skin and subcutaneous tissue: Secondary | ICD-10-CM | POA: Diagnosis not present

## 2016-04-28 DIAGNOSIS — D235 Other benign neoplasm of skin of trunk: Secondary | ICD-10-CM | POA: Diagnosis not present

## 2016-04-28 DIAGNOSIS — D485 Neoplasm of uncertain behavior of skin: Secondary | ICD-10-CM | POA: Diagnosis not present

## 2016-04-28 DIAGNOSIS — C44319 Basal cell carcinoma of skin of other parts of face: Secondary | ICD-10-CM | POA: Diagnosis not present

## 2016-04-28 DIAGNOSIS — L814 Other melanin hyperpigmentation: Secondary | ICD-10-CM | POA: Diagnosis not present

## 2016-05-16 DIAGNOSIS — Z0000 Encounter for general adult medical examination without abnormal findings: Secondary | ICD-10-CM | POA: Diagnosis not present

## 2016-05-16 DIAGNOSIS — I129 Hypertensive chronic kidney disease with stage 1 through stage 4 chronic kidney disease, or unspecified chronic kidney disease: Secondary | ICD-10-CM | POA: Diagnosis not present

## 2016-05-16 DIAGNOSIS — I10 Essential (primary) hypertension: Secondary | ICD-10-CM | POA: Diagnosis not present

## 2016-05-16 DIAGNOSIS — E785 Hyperlipidemia, unspecified: Secondary | ICD-10-CM | POA: Diagnosis not present

## 2016-05-23 DIAGNOSIS — N182 Chronic kidney disease, stage 2 (mild): Secondary | ICD-10-CM | POA: Diagnosis not present

## 2016-05-23 DIAGNOSIS — I129 Hypertensive chronic kidney disease with stage 1 through stage 4 chronic kidney disease, or unspecified chronic kidney disease: Secondary | ICD-10-CM | POA: Diagnosis not present

## 2016-05-23 DIAGNOSIS — E0590 Thyrotoxicosis, unspecified without thyrotoxic crisis or storm: Secondary | ICD-10-CM | POA: Diagnosis not present

## 2016-05-23 DIAGNOSIS — E785 Hyperlipidemia, unspecified: Secondary | ICD-10-CM | POA: Diagnosis not present

## 2016-07-27 DIAGNOSIS — C44319 Basal cell carcinoma of skin of other parts of face: Secondary | ICD-10-CM | POA: Diagnosis not present

## 2016-10-27 DIAGNOSIS — E0590 Thyrotoxicosis, unspecified without thyrotoxic crisis or storm: Secondary | ICD-10-CM | POA: Diagnosis not present

## 2016-10-27 DIAGNOSIS — I129 Hypertensive chronic kidney disease with stage 1 through stage 4 chronic kidney disease, or unspecified chronic kidney disease: Secondary | ICD-10-CM | POA: Diagnosis not present

## 2016-10-27 DIAGNOSIS — E785 Hyperlipidemia, unspecified: Secondary | ICD-10-CM | POA: Diagnosis not present

## 2016-11-03 DIAGNOSIS — E785 Hyperlipidemia, unspecified: Secondary | ICD-10-CM | POA: Diagnosis not present

## 2016-11-03 DIAGNOSIS — K589 Irritable bowel syndrome without diarrhea: Secondary | ICD-10-CM | POA: Diagnosis not present

## 2016-11-03 DIAGNOSIS — N182 Chronic kidney disease, stage 2 (mild): Secondary | ICD-10-CM | POA: Diagnosis not present

## 2016-11-03 DIAGNOSIS — I129 Hypertensive chronic kidney disease with stage 1 through stage 4 chronic kidney disease, or unspecified chronic kidney disease: Secondary | ICD-10-CM | POA: Diagnosis not present

## 2017-01-11 DIAGNOSIS — H52223 Regular astigmatism, bilateral: Secondary | ICD-10-CM | POA: Diagnosis not present

## 2017-01-11 DIAGNOSIS — H3530 Unspecified macular degeneration: Secondary | ICD-10-CM | POA: Diagnosis not present

## 2017-01-11 DIAGNOSIS — H5203 Hypermetropia, bilateral: Secondary | ICD-10-CM | POA: Diagnosis not present

## 2017-01-11 DIAGNOSIS — H524 Presbyopia: Secondary | ICD-10-CM | POA: Diagnosis not present

## 2017-01-16 DIAGNOSIS — Z23 Encounter for immunization: Secondary | ICD-10-CM | POA: Diagnosis not present

## 2017-02-08 DIAGNOSIS — H2513 Age-related nuclear cataract, bilateral: Secondary | ICD-10-CM | POA: Diagnosis not present

## 2017-02-08 DIAGNOSIS — H43813 Vitreous degeneration, bilateral: Secondary | ICD-10-CM | POA: Diagnosis not present

## 2017-02-08 DIAGNOSIS — H353134 Nonexudative age-related macular degeneration, bilateral, advanced atrophic with subfoveal involvement: Secondary | ICD-10-CM | POA: Diagnosis not present

## 2017-05-15 DIAGNOSIS — Z125 Encounter for screening for malignant neoplasm of prostate: Secondary | ICD-10-CM | POA: Diagnosis not present

## 2017-05-15 DIAGNOSIS — E0590 Thyrotoxicosis, unspecified without thyrotoxic crisis or storm: Secondary | ICD-10-CM | POA: Diagnosis not present

## 2017-05-15 DIAGNOSIS — E785 Hyperlipidemia, unspecified: Secondary | ICD-10-CM | POA: Diagnosis not present

## 2017-05-15 DIAGNOSIS — I10 Essential (primary) hypertension: Secondary | ICD-10-CM | POA: Diagnosis not present

## 2017-05-15 DIAGNOSIS — Z0000 Encounter for general adult medical examination without abnormal findings: Secondary | ICD-10-CM | POA: Diagnosis not present

## 2017-05-24 DIAGNOSIS — I129 Hypertensive chronic kidney disease with stage 1 through stage 4 chronic kidney disease, or unspecified chronic kidney disease: Secondary | ICD-10-CM | POA: Diagnosis not present

## 2017-05-24 DIAGNOSIS — N182 Chronic kidney disease, stage 2 (mild): Secondary | ICD-10-CM | POA: Diagnosis not present

## 2017-05-24 DIAGNOSIS — Z0000 Encounter for general adult medical examination without abnormal findings: Secondary | ICD-10-CM | POA: Diagnosis not present

## 2017-05-24 DIAGNOSIS — E785 Hyperlipidemia, unspecified: Secondary | ICD-10-CM | POA: Diagnosis not present

## 2017-05-24 DIAGNOSIS — E0590 Thyrotoxicosis, unspecified without thyrotoxic crisis or storm: Secondary | ICD-10-CM | POA: Diagnosis not present

## 2017-11-14 DIAGNOSIS — I129 Hypertensive chronic kidney disease with stage 1 through stage 4 chronic kidney disease, or unspecified chronic kidney disease: Secondary | ICD-10-CM | POA: Diagnosis not present

## 2017-11-14 DIAGNOSIS — E785 Hyperlipidemia, unspecified: Secondary | ICD-10-CM | POA: Diagnosis not present

## 2017-11-21 DIAGNOSIS — Z0000 Encounter for general adult medical examination without abnormal findings: Secondary | ICD-10-CM | POA: Diagnosis not present

## 2017-11-21 DIAGNOSIS — I10 Essential (primary) hypertension: Secondary | ICD-10-CM | POA: Diagnosis not present

## 2018-02-06 DIAGNOSIS — H5203 Hypermetropia, bilateral: Secondary | ICD-10-CM | POA: Diagnosis not present

## 2018-02-06 DIAGNOSIS — H353131 Nonexudative age-related macular degeneration, bilateral, early dry stage: Secondary | ICD-10-CM | POA: Diagnosis not present

## 2018-03-12 DIAGNOSIS — H2512 Age-related nuclear cataract, left eye: Secondary | ICD-10-CM | POA: Diagnosis not present

## 2018-03-12 DIAGNOSIS — H353131 Nonexudative age-related macular degeneration, bilateral, early dry stage: Secondary | ICD-10-CM | POA: Diagnosis not present

## 2018-03-12 DIAGNOSIS — H2511 Age-related nuclear cataract, right eye: Secondary | ICD-10-CM | POA: Diagnosis not present

## 2018-05-22 DIAGNOSIS — I10 Essential (primary) hypertension: Secondary | ICD-10-CM | POA: Diagnosis not present

## 2018-05-22 DIAGNOSIS — Z0000 Encounter for general adult medical examination without abnormal findings: Secondary | ICD-10-CM | POA: Diagnosis not present

## 2018-05-29 DIAGNOSIS — N182 Chronic kidney disease, stage 2 (mild): Secondary | ICD-10-CM | POA: Diagnosis not present

## 2018-05-29 DIAGNOSIS — I129 Hypertensive chronic kidney disease with stage 1 through stage 4 chronic kidney disease, or unspecified chronic kidney disease: Secondary | ICD-10-CM | POA: Diagnosis not present

## 2018-05-29 DIAGNOSIS — E785 Hyperlipidemia, unspecified: Secondary | ICD-10-CM | POA: Diagnosis not present

## 2018-05-29 DIAGNOSIS — Z0000 Encounter for general adult medical examination without abnormal findings: Secondary | ICD-10-CM | POA: Diagnosis not present

## 2018-11-27 DIAGNOSIS — E0590 Thyrotoxicosis, unspecified without thyrotoxic crisis or storm: Secondary | ICD-10-CM | POA: Diagnosis not present

## 2018-11-27 DIAGNOSIS — I129 Hypertensive chronic kidney disease with stage 1 through stage 4 chronic kidney disease, or unspecified chronic kidney disease: Secondary | ICD-10-CM | POA: Diagnosis not present

## 2018-11-27 DIAGNOSIS — E785 Hyperlipidemia, unspecified: Secondary | ICD-10-CM | POA: Diagnosis not present

## 2018-12-04 DIAGNOSIS — K589 Irritable bowel syndrome without diarrhea: Secondary | ICD-10-CM | POA: Diagnosis not present

## 2018-12-04 DIAGNOSIS — E785 Hyperlipidemia, unspecified: Secondary | ICD-10-CM | POA: Diagnosis not present

## 2018-12-04 DIAGNOSIS — I129 Hypertensive chronic kidney disease with stage 1 through stage 4 chronic kidney disease, or unspecified chronic kidney disease: Secondary | ICD-10-CM | POA: Diagnosis not present

## 2018-12-04 DIAGNOSIS — N182 Chronic kidney disease, stage 2 (mild): Secondary | ICD-10-CM | POA: Diagnosis not present

## 2018-12-04 DIAGNOSIS — E0590 Thyrotoxicosis, unspecified without thyrotoxic crisis or storm: Secondary | ICD-10-CM | POA: Diagnosis not present

## 2018-12-18 DIAGNOSIS — D229 Melanocytic nevi, unspecified: Secondary | ICD-10-CM | POA: Diagnosis not present

## 2018-12-18 DIAGNOSIS — L819 Disorder of pigmentation, unspecified: Secondary | ICD-10-CM | POA: Diagnosis not present

## 2018-12-18 DIAGNOSIS — L821 Other seborrheic keratosis: Secondary | ICD-10-CM | POA: Diagnosis not present

## 2018-12-18 DIAGNOSIS — L814 Other melanin hyperpigmentation: Secondary | ICD-10-CM | POA: Diagnosis not present

## 2018-12-18 DIAGNOSIS — D485 Neoplasm of uncertain behavior of skin: Secondary | ICD-10-CM | POA: Diagnosis not present

## 2019-05-31 DIAGNOSIS — I129 Hypertensive chronic kidney disease with stage 1 through stage 4 chronic kidney disease, or unspecified chronic kidney disease: Secondary | ICD-10-CM | POA: Diagnosis not present

## 2019-05-31 DIAGNOSIS — E0590 Thyrotoxicosis, unspecified without thyrotoxic crisis or storm: Secondary | ICD-10-CM | POA: Diagnosis not present

## 2019-05-31 DIAGNOSIS — Z0000 Encounter for general adult medical examination without abnormal findings: Secondary | ICD-10-CM | POA: Diagnosis not present

## 2019-05-31 DIAGNOSIS — E785 Hyperlipidemia, unspecified: Secondary | ICD-10-CM | POA: Diagnosis not present

## 2019-06-10 DIAGNOSIS — Z0000 Encounter for general adult medical examination without abnormal findings: Secondary | ICD-10-CM | POA: Diagnosis not present

## 2019-06-10 DIAGNOSIS — I129 Hypertensive chronic kidney disease with stage 1 through stage 4 chronic kidney disease, or unspecified chronic kidney disease: Secondary | ICD-10-CM | POA: Diagnosis not present

## 2019-06-10 DIAGNOSIS — E785 Hyperlipidemia, unspecified: Secondary | ICD-10-CM | POA: Diagnosis not present

## 2019-06-10 DIAGNOSIS — N182 Chronic kidney disease, stage 2 (mild): Secondary | ICD-10-CM | POA: Diagnosis not present

## 2019-12-02 DIAGNOSIS — E785 Hyperlipidemia, unspecified: Secondary | ICD-10-CM | POA: Diagnosis not present

## 2019-12-02 DIAGNOSIS — I10 Essential (primary) hypertension: Secondary | ICD-10-CM | POA: Diagnosis not present

## 2019-12-02 DIAGNOSIS — E0590 Thyrotoxicosis, unspecified without thyrotoxic crisis or storm: Secondary | ICD-10-CM | POA: Diagnosis not present

## 2019-12-09 DIAGNOSIS — I129 Hypertensive chronic kidney disease with stage 1 through stage 4 chronic kidney disease, or unspecified chronic kidney disease: Secondary | ICD-10-CM | POA: Diagnosis not present

## 2019-12-09 DIAGNOSIS — K589 Irritable bowel syndrome without diarrhea: Secondary | ICD-10-CM | POA: Diagnosis not present

## 2019-12-09 DIAGNOSIS — N182 Chronic kidney disease, stage 2 (mild): Secondary | ICD-10-CM | POA: Diagnosis not present

## 2019-12-09 DIAGNOSIS — E785 Hyperlipidemia, unspecified: Secondary | ICD-10-CM | POA: Diagnosis not present

## 2019-12-09 DIAGNOSIS — E0590 Thyrotoxicosis, unspecified without thyrotoxic crisis or storm: Secondary | ICD-10-CM | POA: Diagnosis not present

## 2020-06-08 DIAGNOSIS — I129 Hypertensive chronic kidney disease with stage 1 through stage 4 chronic kidney disease, or unspecified chronic kidney disease: Secondary | ICD-10-CM | POA: Diagnosis not present

## 2020-06-08 DIAGNOSIS — E0590 Thyrotoxicosis, unspecified without thyrotoxic crisis or storm: Secondary | ICD-10-CM | POA: Diagnosis not present

## 2020-06-08 DIAGNOSIS — Z0000 Encounter for general adult medical examination without abnormal findings: Secondary | ICD-10-CM | POA: Diagnosis not present

## 2020-06-08 DIAGNOSIS — E785 Hyperlipidemia, unspecified: Secondary | ICD-10-CM | POA: Diagnosis not present

## 2020-06-08 DIAGNOSIS — I10 Essential (primary) hypertension: Secondary | ICD-10-CM | POA: Diagnosis not present

## 2020-06-08 DIAGNOSIS — Z79899 Other long term (current) drug therapy: Secondary | ICD-10-CM | POA: Diagnosis not present

## 2020-06-16 DIAGNOSIS — Z79899 Other long term (current) drug therapy: Secondary | ICD-10-CM | POA: Diagnosis not present

## 2020-06-16 DIAGNOSIS — E785 Hyperlipidemia, unspecified: Secondary | ICD-10-CM | POA: Diagnosis not present

## 2020-06-16 DIAGNOSIS — Z0000 Encounter for general adult medical examination without abnormal findings: Secondary | ICD-10-CM | POA: Diagnosis not present

## 2020-06-16 DIAGNOSIS — I129 Hypertensive chronic kidney disease with stage 1 through stage 4 chronic kidney disease, or unspecified chronic kidney disease: Secondary | ICD-10-CM | POA: Diagnosis not present

## 2020-06-16 DIAGNOSIS — R7309 Other abnormal glucose: Secondary | ICD-10-CM | POA: Diagnosis not present

## 2020-07-21 DIAGNOSIS — H2512 Age-related nuclear cataract, left eye: Secondary | ICD-10-CM | POA: Diagnosis not present

## 2020-07-21 DIAGNOSIS — H353110 Nonexudative age-related macular degeneration, right eye, stage unspecified: Secondary | ICD-10-CM | POA: Diagnosis not present

## 2020-07-21 DIAGNOSIS — H2511 Age-related nuclear cataract, right eye: Secondary | ICD-10-CM | POA: Diagnosis not present

## 2020-07-21 DIAGNOSIS — H353190 Nonexudative age-related macular degeneration, unspecified eye, stage unspecified: Secondary | ICD-10-CM | POA: Diagnosis not present

## 2020-07-21 DIAGNOSIS — Z01818 Encounter for other preprocedural examination: Secondary | ICD-10-CM | POA: Diagnosis not present

## 2020-08-06 DIAGNOSIS — H2511 Age-related nuclear cataract, right eye: Secondary | ICD-10-CM | POA: Diagnosis not present

## 2020-08-20 DIAGNOSIS — H2512 Age-related nuclear cataract, left eye: Secondary | ICD-10-CM | POA: Diagnosis not present

## 2020-09-23 DIAGNOSIS — Z961 Presence of intraocular lens: Secondary | ICD-10-CM | POA: Diagnosis not present

## 2020-12-11 DIAGNOSIS — I129 Hypertensive chronic kidney disease with stage 1 through stage 4 chronic kidney disease, or unspecified chronic kidney disease: Secondary | ICD-10-CM | POA: Diagnosis not present

## 2020-12-11 DIAGNOSIS — E785 Hyperlipidemia, unspecified: Secondary | ICD-10-CM | POA: Diagnosis not present

## 2020-12-11 DIAGNOSIS — Z8639 Personal history of other endocrine, nutritional and metabolic disease: Secondary | ICD-10-CM | POA: Diagnosis not present

## 2020-12-11 DIAGNOSIS — R7309 Other abnormal glucose: Secondary | ICD-10-CM | POA: Diagnosis not present

## 2020-12-17 DIAGNOSIS — I129 Hypertensive chronic kidney disease with stage 1 through stage 4 chronic kidney disease, or unspecified chronic kidney disease: Secondary | ICD-10-CM | POA: Diagnosis not present

## 2020-12-17 DIAGNOSIS — E785 Hyperlipidemia, unspecified: Secondary | ICD-10-CM | POA: Diagnosis not present

## 2020-12-17 DIAGNOSIS — Z8639 Personal history of other endocrine, nutritional and metabolic disease: Secondary | ICD-10-CM | POA: Diagnosis not present

## 2020-12-17 DIAGNOSIS — E559 Vitamin D deficiency, unspecified: Secondary | ICD-10-CM | POA: Diagnosis not present

## 2020-12-17 DIAGNOSIS — R7309 Other abnormal glucose: Secondary | ICD-10-CM | POA: Diagnosis not present

## 2021-06-24 ENCOUNTER — Ambulatory Visit: Payer: Medicare Other | Admitting: Podiatry

## 2021-06-24 ENCOUNTER — Encounter: Payer: Self-pay | Admitting: Podiatry

## 2021-06-24 ENCOUNTER — Other Ambulatory Visit: Payer: Self-pay

## 2021-06-24 DIAGNOSIS — L600 Ingrowing nail: Secondary | ICD-10-CM | POA: Diagnosis not present

## 2021-06-24 NOTE — Progress Notes (Signed)
?  Subjective:  ?Patient ID: Ian Ritter, male    DOB: 1940/07/18,   MRN: [id number] ? ?Chief Complaint  ?Patient presents with  ? Nail Problem  ?   ?  L great toe ingrown nail  ? ? ?81 y.o. male presents for concern of left great ingrown toenail for about a month. Relates he had some discharge and redness. Has tried soaking which helped. Marland KitchenHe is not diabetic Denies any other pedal complaints. Denies n/v/f/c.  ? ?Past Medical History:  ?Diagnosis Date  ? Diverticulitis   ? Herniated disc   ? Hypertension   ? ? ?Objective:  ?Physical Exam: ?Vascular: DP/PT pulses 2/4 bilateral. CFT <3 seconds. Normal hair growth on digits. No edema. Purple discoloration noted to distal digits bilateral consistant with some possible raynauds.  ?Skin. No lacerations or abrasions bilateral feet. Left hallux lateral border incurvated. No erythema or edema noted.  ?Musculoskeletal: MMT 5/5 bilateral lower extremities in DF, PF, Inversion and Eversion. Deceased ROM in DF of ankle joint. Tender to distal lateral left hallux nail.  ?Neurological: Sensation intact to light touch.  ? ?Assessment:  ? ?1. Ingrown left greater toenail   ? ? ? ?Plan:  ?Patient was evaluated and treated and all questions answered. ?Discussed ingrown nails with patient.  ?Discussed procedure vs simply trimming nail back to help with ingrown portion of the nail. Patient would like to just try trimming the nail back.  ?Left hallux nail was debrided in slant back fashion without incident.  ?Advised to continue soaks and neosporin and bandaid for next two weeks and will see how he does.  ?Patient to follow-up as need in the future for possible ingrown nail procedure.  ? ? ? ? ?Lorenda Peck, DPM  ? ? ?

## 2021-12-08 DIAGNOSIS — H353133 Nonexudative age-related macular degeneration, bilateral, advanced atrophic without subfoveal involvement: Secondary | ICD-10-CM | POA: Diagnosis not present

## 2021-12-08 DIAGNOSIS — H524 Presbyopia: Secondary | ICD-10-CM | POA: Diagnosis not present

## 2021-12-08 DIAGNOSIS — H26493 Other secondary cataract, bilateral: Secondary | ICD-10-CM | POA: Diagnosis not present

## 2021-12-29 DIAGNOSIS — R7309 Other abnormal glucose: Secondary | ICD-10-CM | POA: Diagnosis not present

## 2021-12-29 DIAGNOSIS — Z8639 Personal history of other endocrine, nutritional and metabolic disease: Secondary | ICD-10-CM | POA: Diagnosis not present

## 2021-12-29 DIAGNOSIS — E785 Hyperlipidemia, unspecified: Secondary | ICD-10-CM | POA: Diagnosis not present

## 2021-12-29 DIAGNOSIS — E559 Vitamin D deficiency, unspecified: Secondary | ICD-10-CM | POA: Diagnosis not present

## 2021-12-29 DIAGNOSIS — E0590 Thyrotoxicosis, unspecified without thyrotoxic crisis or storm: Secondary | ICD-10-CM | POA: Diagnosis not present

## 2022-01-06 DIAGNOSIS — N1831 Chronic kidney disease, stage 3a: Secondary | ICD-10-CM | POA: Diagnosis not present

## 2022-01-06 DIAGNOSIS — Z8639 Personal history of other endocrine, nutritional and metabolic disease: Secondary | ICD-10-CM | POA: Diagnosis not present

## 2022-01-06 DIAGNOSIS — R7309 Other abnormal glucose: Secondary | ICD-10-CM | POA: Diagnosis not present

## 2022-01-06 DIAGNOSIS — E559 Vitamin D deficiency, unspecified: Secondary | ICD-10-CM | POA: Diagnosis not present

## 2022-01-06 DIAGNOSIS — I129 Hypertensive chronic kidney disease with stage 1 through stage 4 chronic kidney disease, or unspecified chronic kidney disease: Secondary | ICD-10-CM | POA: Diagnosis not present

## 2022-01-06 DIAGNOSIS — E785 Hyperlipidemia, unspecified: Secondary | ICD-10-CM | POA: Diagnosis not present

## 2022-01-14 DIAGNOSIS — H353124 Nonexudative age-related macular degeneration, left eye, advanced atrophic with subfoveal involvement: Secondary | ICD-10-CM | POA: Diagnosis not present

## 2022-01-14 DIAGNOSIS — H353113 Nonexudative age-related macular degeneration, right eye, advanced atrophic without subfoveal involvement: Secondary | ICD-10-CM | POA: Diagnosis not present

## 2022-02-11 DIAGNOSIS — H353113 Nonexudative age-related macular degeneration, right eye, advanced atrophic without subfoveal involvement: Secondary | ICD-10-CM | POA: Diagnosis not present

## 2022-02-11 DIAGNOSIS — H353124 Nonexudative age-related macular degeneration, left eye, advanced atrophic with subfoveal involvement: Secondary | ICD-10-CM | POA: Diagnosis not present

## 2022-04-13 DIAGNOSIS — H353113 Nonexudative age-related macular degeneration, right eye, advanced atrophic without subfoveal involvement: Secondary | ICD-10-CM | POA: Diagnosis not present

## 2022-04-13 DIAGNOSIS — H353124 Nonexudative age-related macular degeneration, left eye, advanced atrophic with subfoveal involvement: Secondary | ICD-10-CM | POA: Diagnosis not present

## 2022-06-08 DIAGNOSIS — H353113 Nonexudative age-related macular degeneration, right eye, advanced atrophic without subfoveal involvement: Secondary | ICD-10-CM | POA: Diagnosis not present

## 2022-06-08 DIAGNOSIS — H353124 Nonexudative age-related macular degeneration, left eye, advanced atrophic with subfoveal involvement: Secondary | ICD-10-CM | POA: Diagnosis not present

## 2022-06-29 DIAGNOSIS — R7309 Other abnormal glucose: Secondary | ICD-10-CM | POA: Diagnosis not present

## 2022-06-29 DIAGNOSIS — I129 Hypertensive chronic kidney disease with stage 1 through stage 4 chronic kidney disease, or unspecified chronic kidney disease: Secondary | ICD-10-CM | POA: Diagnosis not present

## 2022-06-29 DIAGNOSIS — Z8639 Personal history of other endocrine, nutritional and metabolic disease: Secondary | ICD-10-CM | POA: Diagnosis not present

## 2022-06-29 DIAGNOSIS — E785 Hyperlipidemia, unspecified: Secondary | ICD-10-CM | POA: Diagnosis not present

## 2022-06-29 DIAGNOSIS — E559 Vitamin D deficiency, unspecified: Secondary | ICD-10-CM | POA: Diagnosis not present

## 2022-07-04 DIAGNOSIS — Z08 Encounter for follow-up examination after completed treatment for malignant neoplasm: Secondary | ICD-10-CM | POA: Diagnosis not present

## 2022-07-04 DIAGNOSIS — C44619 Basal cell carcinoma of skin of left upper limb, including shoulder: Secondary | ICD-10-CM | POA: Diagnosis not present

## 2022-07-04 DIAGNOSIS — Z85828 Personal history of other malignant neoplasm of skin: Secondary | ICD-10-CM | POA: Diagnosis not present

## 2022-07-04 DIAGNOSIS — L814 Other melanin hyperpigmentation: Secondary | ICD-10-CM | POA: Diagnosis not present

## 2022-07-04 DIAGNOSIS — L538 Other specified erythematous conditions: Secondary | ICD-10-CM | POA: Diagnosis not present

## 2022-07-04 DIAGNOSIS — D225 Melanocytic nevi of trunk: Secondary | ICD-10-CM | POA: Diagnosis not present

## 2022-07-04 DIAGNOSIS — L821 Other seborrheic keratosis: Secondary | ICD-10-CM | POA: Diagnosis not present

## 2022-07-06 DIAGNOSIS — R7309 Other abnormal glucose: Secondary | ICD-10-CM | POA: Diagnosis not present

## 2022-07-06 DIAGNOSIS — Z0000 Encounter for general adult medical examination without abnormal findings: Secondary | ICD-10-CM | POA: Diagnosis not present

## 2022-07-06 DIAGNOSIS — Z8639 Personal history of other endocrine, nutritional and metabolic disease: Secondary | ICD-10-CM | POA: Diagnosis not present

## 2022-07-06 DIAGNOSIS — I129 Hypertensive chronic kidney disease with stage 1 through stage 4 chronic kidney disease, or unspecified chronic kidney disease: Secondary | ICD-10-CM | POA: Diagnosis not present

## 2022-08-03 DIAGNOSIS — H353113 Nonexudative age-related macular degeneration, right eye, advanced atrophic without subfoveal involvement: Secondary | ICD-10-CM | POA: Diagnosis not present

## 2022-08-03 DIAGNOSIS — H353124 Nonexudative age-related macular degeneration, left eye, advanced atrophic with subfoveal involvement: Secondary | ICD-10-CM | POA: Diagnosis not present

## 2022-10-28 DIAGNOSIS — H353133 Nonexudative age-related macular degeneration, bilateral, advanced atrophic without subfoveal involvement: Secondary | ICD-10-CM | POA: Diagnosis not present

## 2022-12-23 DIAGNOSIS — H353124 Nonexudative age-related macular degeneration, left eye, advanced atrophic with subfoveal involvement: Secondary | ICD-10-CM | POA: Diagnosis not present

## 2022-12-23 DIAGNOSIS — H353113 Nonexudative age-related macular degeneration, right eye, advanced atrophic without subfoveal involvement: Secondary | ICD-10-CM | POA: Diagnosis not present

## 2022-12-23 DIAGNOSIS — H353132 Nonexudative age-related macular degeneration, bilateral, intermediate dry stage: Secondary | ICD-10-CM | POA: Diagnosis not present

## 2023-01-03 DIAGNOSIS — I129 Hypertensive chronic kidney disease with stage 1 through stage 4 chronic kidney disease, or unspecified chronic kidney disease: Secondary | ICD-10-CM | POA: Diagnosis not present

## 2023-01-03 DIAGNOSIS — R7309 Other abnormal glucose: Secondary | ICD-10-CM | POA: Diagnosis not present

## 2023-01-09 DIAGNOSIS — L821 Other seborrheic keratosis: Secondary | ICD-10-CM | POA: Diagnosis not present

## 2023-01-09 DIAGNOSIS — Z08 Encounter for follow-up examination after completed treatment for malignant neoplasm: Secondary | ICD-10-CM | POA: Diagnosis not present

## 2023-01-09 DIAGNOSIS — Z85828 Personal history of other malignant neoplasm of skin: Secondary | ICD-10-CM | POA: Diagnosis not present

## 2023-01-09 DIAGNOSIS — D225 Melanocytic nevi of trunk: Secondary | ICD-10-CM | POA: Diagnosis not present

## 2023-01-09 DIAGNOSIS — L814 Other melanin hyperpigmentation: Secondary | ICD-10-CM | POA: Diagnosis not present

## 2023-01-10 DIAGNOSIS — I129 Hypertensive chronic kidney disease with stage 1 through stage 4 chronic kidney disease, or unspecified chronic kidney disease: Secondary | ICD-10-CM | POA: Diagnosis not present

## 2023-01-10 DIAGNOSIS — R7309 Other abnormal glucose: Secondary | ICD-10-CM | POA: Diagnosis not present

## 2023-01-10 DIAGNOSIS — N1831 Chronic kidney disease, stage 3a: Secondary | ICD-10-CM | POA: Diagnosis not present

## 2023-01-10 DIAGNOSIS — E785 Hyperlipidemia, unspecified: Secondary | ICD-10-CM | POA: Diagnosis not present

## 2023-01-10 DIAGNOSIS — Z23 Encounter for immunization: Secondary | ICD-10-CM | POA: Diagnosis not present

## 2023-01-10 DIAGNOSIS — Z8639 Personal history of other endocrine, nutritional and metabolic disease: Secondary | ICD-10-CM | POA: Diagnosis not present

## 2023-02-17 DIAGNOSIS — H353133 Nonexudative age-related macular degeneration, bilateral, advanced atrophic without subfoveal involvement: Secondary | ICD-10-CM | POA: Diagnosis not present

## 2023-04-21 DIAGNOSIS — H353124 Nonexudative age-related macular degeneration, left eye, advanced atrophic with subfoveal involvement: Secondary | ICD-10-CM | POA: Diagnosis not present

## 2023-04-21 DIAGNOSIS — H353113 Nonexudative age-related macular degeneration, right eye, advanced atrophic without subfoveal involvement: Secondary | ICD-10-CM | POA: Diagnosis not present

## 2023-05-15 DIAGNOSIS — Z860100 Personal history of colon polyps, unspecified: Secondary | ICD-10-CM | POA: Diagnosis not present

## 2023-05-15 DIAGNOSIS — R197 Diarrhea, unspecified: Secondary | ICD-10-CM | POA: Diagnosis not present

## 2023-05-15 DIAGNOSIS — R112 Nausea with vomiting, unspecified: Secondary | ICD-10-CM | POA: Diagnosis not present

## 2023-06-16 DIAGNOSIS — H353113 Nonexudative age-related macular degeneration, right eye, advanced atrophic without subfoveal involvement: Secondary | ICD-10-CM | POA: Diagnosis not present

## 2023-06-16 DIAGNOSIS — H353124 Nonexudative age-related macular degeneration, left eye, advanced atrophic with subfoveal involvement: Secondary | ICD-10-CM | POA: Diagnosis not present

## 2023-07-04 DIAGNOSIS — I129 Hypertensive chronic kidney disease with stage 1 through stage 4 chronic kidney disease, or unspecified chronic kidney disease: Secondary | ICD-10-CM | POA: Diagnosis not present

## 2023-07-04 DIAGNOSIS — N1831 Chronic kidney disease, stage 3a: Secondary | ICD-10-CM | POA: Diagnosis not present

## 2023-07-04 DIAGNOSIS — E785 Hyperlipidemia, unspecified: Secondary | ICD-10-CM | POA: Diagnosis not present

## 2023-07-04 DIAGNOSIS — R7309 Other abnormal glucose: Secondary | ICD-10-CM | POA: Diagnosis not present

## 2023-07-18 DIAGNOSIS — R7309 Other abnormal glucose: Secondary | ICD-10-CM | POA: Diagnosis not present

## 2023-07-18 DIAGNOSIS — Z0000 Encounter for general adult medical examination without abnormal findings: Secondary | ICD-10-CM | POA: Diagnosis not present

## 2023-07-18 DIAGNOSIS — N1831 Chronic kidney disease, stage 3a: Secondary | ICD-10-CM | POA: Diagnosis not present

## 2023-08-11 DIAGNOSIS — H353113 Nonexudative age-related macular degeneration, right eye, advanced atrophic without subfoveal involvement: Secondary | ICD-10-CM | POA: Diagnosis not present

## 2023-08-11 DIAGNOSIS — H353124 Nonexudative age-related macular degeneration, left eye, advanced atrophic with subfoveal involvement: Secondary | ICD-10-CM | POA: Diagnosis not present

## 2023-10-06 DIAGNOSIS — H353133 Nonexudative age-related macular degeneration, bilateral, advanced atrophic without subfoveal involvement: Secondary | ICD-10-CM | POA: Diagnosis not present

## 2023-12-01 DIAGNOSIS — H353133 Nonexudative age-related macular degeneration, bilateral, advanced atrophic without subfoveal involvement: Secondary | ICD-10-CM | POA: Diagnosis not present

## 2024-01-09 DIAGNOSIS — Z85828 Personal history of other malignant neoplasm of skin: Secondary | ICD-10-CM | POA: Diagnosis not present

## 2024-01-09 DIAGNOSIS — Z08 Encounter for follow-up examination after completed treatment for malignant neoplasm: Secondary | ICD-10-CM | POA: Diagnosis not present

## 2024-01-09 DIAGNOSIS — L814 Other melanin hyperpigmentation: Secondary | ICD-10-CM | POA: Diagnosis not present

## 2024-01-09 DIAGNOSIS — L821 Other seborrheic keratosis: Secondary | ICD-10-CM | POA: Diagnosis not present

## 2024-01-09 DIAGNOSIS — L570 Actinic keratosis: Secondary | ICD-10-CM | POA: Diagnosis not present

## 2024-01-23 DIAGNOSIS — I129 Hypertensive chronic kidney disease with stage 1 through stage 4 chronic kidney disease, or unspecified chronic kidney disease: Secondary | ICD-10-CM | POA: Diagnosis not present

## 2024-01-23 DIAGNOSIS — Z8639 Personal history of other endocrine, nutritional and metabolic disease: Secondary | ICD-10-CM | POA: Diagnosis not present

## 2024-01-26 DIAGNOSIS — H353124 Nonexudative age-related macular degeneration, left eye, advanced atrophic with subfoveal involvement: Secondary | ICD-10-CM | POA: Diagnosis not present

## 2024-01-26 DIAGNOSIS — H353113 Nonexudative age-related macular degeneration, right eye, advanced atrophic without subfoveal involvement: Secondary | ICD-10-CM | POA: Diagnosis not present

## 2024-01-30 DIAGNOSIS — R7309 Other abnormal glucose: Secondary | ICD-10-CM | POA: Diagnosis not present

## 2024-01-30 DIAGNOSIS — H3530 Unspecified macular degeneration: Secondary | ICD-10-CM | POA: Diagnosis not present

## 2024-01-30 DIAGNOSIS — N1831 Chronic kidney disease, stage 3a: Secondary | ICD-10-CM | POA: Diagnosis not present

## 2024-01-30 DIAGNOSIS — I129 Hypertensive chronic kidney disease with stage 1 through stage 4 chronic kidney disease, or unspecified chronic kidney disease: Secondary | ICD-10-CM | POA: Diagnosis not present

## 2024-01-30 DIAGNOSIS — R079 Chest pain, unspecified: Secondary | ICD-10-CM | POA: Diagnosis not present

## 2024-01-30 DIAGNOSIS — Z23 Encounter for immunization: Secondary | ICD-10-CM | POA: Diagnosis not present

## 2024-01-30 DIAGNOSIS — Z8639 Personal history of other endocrine, nutritional and metabolic disease: Secondary | ICD-10-CM | POA: Diagnosis not present

## 2024-01-31 ENCOUNTER — Ambulatory Visit: Attending: Student | Admitting: Student

## 2024-01-31 ENCOUNTER — Encounter: Payer: Self-pay | Admitting: Student

## 2024-01-31 ENCOUNTER — Telehealth: Payer: Self-pay

## 2024-01-31 VITALS — BP 146/90 | HR 61 | Ht 71.0 in | Wt 220.0 lb

## 2024-01-31 DIAGNOSIS — I10 Essential (primary) hypertension: Secondary | ICD-10-CM

## 2024-01-31 DIAGNOSIS — E785 Hyperlipidemia, unspecified: Secondary | ICD-10-CM

## 2024-01-31 DIAGNOSIS — R079 Chest pain, unspecified: Secondary | ICD-10-CM | POA: Diagnosis not present

## 2024-01-31 DIAGNOSIS — N1831 Chronic kidney disease, stage 3a: Secondary | ICD-10-CM | POA: Diagnosis not present

## 2024-01-31 DIAGNOSIS — I4510 Unspecified right bundle-branch block: Secondary | ICD-10-CM

## 2024-01-31 MED ORDER — NITROGLYCERIN 0.4 MG SL SUBL: 0.4000 mg | SUBLINGUAL_TABLET | SUBLINGUAL | 2 refills | Status: AC | PRN

## 2024-01-31 NOTE — Patient Instructions (Signed)
 Thank you for choosing [facility]!     Medication Instructions:  Continue to take your medications as prescribed.  Monitor your blood pressure. Let us  know if the pressure readings are above 130/80.  *If you need a refill on your cardiac medications before your next appointment, please call your pharmacy*   Lab Work: No labs were ordered during today's visit.  If you have labs (blood work) drawn today and your tests are completely normal, you will receive your results only by: MyChart Message (if you have MyChart) OR A paper copy in the mail If you have any lab test that is abnormal or we need to change your treatment, we will call you to review the results.   Testing/Procedures:   Your cardiac CT will be scheduled at one of the below locations:   [facility] [address] [place], [place] [address] [phone] (Severe contrast allergies only)  OR   [facility] [address] [place], [place] [address] [phone]  OR   [facility] [address] [place], [place] [address] [phone]  OR   [facility] [address]  [place], [place] [address] [phone]  OR   [facility] [address] [place], [place] [phone]  If scheduled at [facility], please arrive at the [facility] and Children's Entrance (Entrance C2) of [facility] 30 minutes prior to test start time. You can use the FREE valet parking offered at entrance C (encouraged to control the heart rate for the test)  Proceed to the [facility] Radiology Department (first floor) to check-in and test prep.  All radiology patients and guests should use entrance C2 at [facility], accessed from [facility], even though the hospital's physical address listed is [address]  If scheduled at the Heart and Vascular Tower at [address], please enter the  parking lot using the [address] entrance and use the FREE valet service at the patient drop-off area. Enter the building and check-in with registration on the main floor.  If scheduled at [facility], please arrive to the Heart and Vascular Center 15 mins early for check-in and test prep.  There is spacious parking and easy access to the radiology department from the [facility] V[facility] entrance. Please enter here and check-in with the desk attendant.   If scheduled at [facility], please arrive 30 minutes early for check-in and test prep.  Please follow these instructions carefully (unless otherwise directed):  An IV will be required for this test and Nitroglycerin will be given.  Hold all erectile dysfunction medications at least 3 days (72 hrs) prior to test. (Ie viagra, cialis, sildenafil, tadalafil, etc)   On the Night Before the Test: Be sure to Drink plenty of water. Do not consume any caffeinated/decaffeinated beverages or chocolate 12 hours prior to your test. Do not take any antihistamines 12 hours prior to your test.  If the patient has contrast allergy: Patient will need a prescription for Prednisone and very clear instructions (as follows): Prednisone 50 mg - take 13 hours prior to test Take another Prednisone 50 mg 7 hours prior to test Take another Prednisone 50 mg 1 hour prior to test Take Benadryl  50 mg 1 hour prior to test Patient must complete all four doses of above prophylactic medications. Patient will need a ride after test due to Benadryl .  On  the Day of the Test: Drink plenty of water until 1 hour prior to the test. Do not eat any food 1 hour prior to test. You may take your regular medications prior to the test.  Take metoprolol  (Lopressor ) two hours prior to test. If you take Furosemide/Hydrochlorothiazide/Spironolactone/Chlorthalidone, please HOLD on the morning of the test. Patients who wear a continuous glucose  monitor MUST remove the device prior to scanning. FEMALES- please wear underwire-free bra if available, avoid dresses & tight clothing          After the Test: Drink plenty of water. After receiving IV contrast, you may experience a mild flushed feeling. This is normal. On occasion, you may experience a mild rash up to 24 hours after the test. This is not dangerous. If this occurs, you can take Benadryl  25 mg, Zyrtec, Claritin, or Allegra and increase your fluid intake. (Patients taking Tikosyn should avoid Benadryl , and may take Zyrtec, Claritin, or Allegra) If you experience trouble breathing, this can be serious. If it is severe call 911 IMMEDIATELY. If it is mild, please call our office.  We will call to schedule your test 2-4 weeks out understanding that some insurance companies will need an authorization prior to the service being performed.   For more information and frequently asked questions, please visit our website : [email or web address]  For non-scheduling related questions, please contact the cardiac imaging nurse navigator should you have any questions/concerns: Cardiac Imaging Nurse Navigators Direct Office Dial: [phone]   For scheduling needs, including cancellations and rescheduling, please call [place], [phone].    Your physician has requested that you have an echocardiogram. Echocardiography is a painless test that uses sound waves to create images of your heart. It provides your doctor with information about the size and shape of your heart and how well your heart's chambers and valves are working. This procedure takes approximately one hour. There are no restrictions for this procedure. Please do NOT wear cologne, perfume, aftershave, or lotions (deodorant is allowed). Please arrive 15 minutes prior to your appointment time.  Please note: We ask at that you not bring children with you during ultrasound (echo/ vascular) testing. Due to room size  and safety concerns, children are not allowed in the ultrasound rooms during exams. Our front office staff cannot provide observation of children in our lobby area while testing is being conducted. An adult accompanying a patient to their appointment will only be allowed in the ultrasound room at the discretion of the ultrasound technician under special circumstances. We apologize for any inconvenience.   Your next appointment:   3 month(s)   Provider:   Darryle Cheney, MD     Follow-Up: At [facility], you and your health needs are our priority.  As part of our continuing mission to provide you with exceptional heart care, we have created designated Provider Care Teams.  These Care Teams include your primary Cardiologist (physician) and Advanced Practice Providers (APPs -  Physician Assistants and Nurse Practitioners) who all work together to provide you with the care you need, when you need it. We recommend signing up for the patient portal called MyChart.  Sign up information is provided on this After Visit Summary.  MyChart is used to connect with patients for Virtual Visits (Telemedicine).  Patients are able to view lab/test results, encounter notes, upcoming appointments, etc.  Non-urgent messages can be sent to your provider as well.   To learn more about what you can do with MyChart, go to  [email or web address].

## 2024-01-31 NOTE — H&P (View-Only) (Signed)
 Cardiology [facility]:    Date:  01/31/2024   ID:  SHAKEEL DISNEY, DOB February 09, 1941, MRN [id number]  PCP:  Gerome Brunet, DO  Cardiologist:  Darryle ONEIDA Decent, MD Cardiology APP:  Rose Hegner E, PA-C     Referring MD: Gerome Brunet, DO   Chief Complaint: chest pain  History of Present Illness:    Ian Ritter is a 83 y.o. male with a history of hypertension, hyperlipidemia, transient thyrotoxicosis, CKD stage IIIa, and diverticulitis s/p resection and anastomosis in 2007, and IBS who is presents today as a new patient in the [facility] for further evaluation of chest pain.  Patient was recently seen by PCP on 01/30/2024 and reported chest tightness with exertion.  Therefore, he was referred to Cardiology for further evaluation.  He presents today with his wife.  He reports exertional chest pain for the past 3-4 months that typically occurs after he has been walking briskly or when walking up an incline for about 5 -10 minutes.  He describes this as a burning sensation that will radiate to his arms and his neck if he does not stop.  Symptoms will resolve quickly once he stops and rests.  He describes getting a little winded with these activities as well but states this is not new.  He denies any significant shortness of breath.  Otherwise, no cardiac complaints.  No orthopnea, PND, edema, palpitations, lightheadedness/dizziness, syncope.  He does have a family history of cardiovascular disease.  His father had hypertension and stroke.  His mother had CHF in her 2s.  He denies any history of tobacco use.  Reviewed recent labs from PCP's office on 01/23/2024: - CBC: WBC 6.4, Hgb 13.7, Plts 187 - CMET: Na 140, K 4.0, Glucose 97, BUN 19, Cr 1.44 (eGFR 48), Albumin 4.3, AST 22, ALT 18, Alk Phos 105, Total Bili 0.6 - TSH 0.674 (normal) - PSA elevated at 6.8  - Lipid panel from 06/2023: Total Cholesterol 170, Triglycerides 142, HLD 34, LDL 111 - Hemoglobin A1c from  06/2023: 5.5%  EKGs/Labs/Other Studies Reviewed:    The following studies were reviewed:  N/A  EKG:  EKG ordered today.   EKG Interpretation Date/Time:  Wednesday January 31 2024 14:55:02 EDT Ventricular Rate:  61 PR Interval:  190 QRS Duration:  136 QT Interval:  454 QTC Calculation: 457 R Axis:   47  Text Interpretation: Normal sinus rhythm Right bundle branch block  T wave inversions in inferior leads Confirmed by Jadine Patient [phone]) on 01/31/2024 3:20:56 PM    Recent Labs: No results found for requested labs within last 365 days.  Recent Lipid Panel No results found for: CHOL, TRIG, HDL, CHOLHDL, VLDL, LDLCALC, LDLDIRECT  Physical Exam:    Vital Signs: BP (!) 146/90 (BP Location: Right Arm, Patient Position: Sitting, Cuff Size: Normal)   Pulse 61   Ht 5' 11 (1.803 m)   SpO2 97%     Wt Readings from Last 3 Encounters:  04/26/12 226 lb 12.8 oz (102.9 kg)  12/21/11 221 lb (100.2 kg)  11/26/11 227 lb 11.2 oz (103.3 kg)     General: 83 y.o. Caucasian male in no acute distress. HEENT: Normocephalic and atraumatic. Sclera clear.  Neck: Supple. No carotid bruits. No JVD. Heart: RRR. Distinct S1 and S2. No murmurs, gallops, or rubs.  Lungs: No increased work of breathing. Clear to ausculation bilaterally. No wheezes, rhonchi, or rales.  Extremities: No lower extremity edema.   Skin: Warm and dry.  Neuro: No focal deficits. Psych: Normal affect. Responds appropriately.   Assessment:    1. Chest pain on exertion   2. RBBB   3. Primary hypertension   4. Hyperlipidemia, unspecified hyperlipidemia type   5. Stage 3a chronic kidney disease (HCC)     Plan:    Chest Pain RBBB Patient reports new exertional chest pain over the last 3 to 4 months that sounds like typical angina - EKG today shows normal sinus rhythm with a RBBB and T wave inversions in inferior leads. - He recently started taking Aspirin 81mg  daily. Agree with this. - Will provide  prescription of PRN sublingual Nitroglycerin. Educated patient on how to use this. - Will order Echo to assesses for structural heart disease.  - Will order coronary CTA to assess for obstructive CAD. Recent BMET from PCP's office on 01/23/2024 showed creatinine of 1.44 (eGFR of 48). Already on twice daily Lopressor  and heart rate in appropriate range.    Hypertension BP elevated in the office today. Initially 146/90 and then 160/92 on my personal recheck at the end of visit. However, patient states he monitors his BP at home and it is usually well controlled. - Continue current medications: Lisonopril 20mg  daily and Lopressor  50mg  twice daily.  - Asked patient to let us  know if BP consistently above goal >130/80 at home at which point we would need to adjust medications.  Hyperlipidemia Lipid panel in 06/2023 at PCP's office: Total Cholesterol 170, Triglycerides 142, HDL 34, LDL 111.  - Will wait for results of coronary CTA before deciding on what statin/ dose is needed.   CKD Stage IIIa Baseline creatinine looks to be around 1.2 to 1.5 over the last couple of years.   Disposition: Follow up in 2-3 months (sooner if coronary CTA shows concerning findings). Will have patient get established with Dr. Barbaraann who is one of the DODs today.    Signed, Aline FORBES Door, PA-C  01/31/2024 5:22 PM    [facility]  ADDENDUM 02/13/2024: Coronary CTA showed a coronary calcium score 376 (42nd percentile for age and sex) with mild focal calcified plaque in the proximal LAD immediately followed by a moderate soft plaque that extends into a severe calcified plaque within noted blooming artifact as well as moderate soft plaque in the proximal portion of the PDA.  FFR showed  that the LAD lesion was hemodynamically significant.  Cardiac catheterization was recommended.  I personally called and reviewed the results of the coronary CTA with patient today and reviewed recommendations for cardiac  catheterization.  He is agreeable to this.  LHC has been scheduled for 02/19/2024 with Dr. Mady.  He is already on aspirin 81 mg daily.  Will add Lipitor 80 mg daily.  He will then need repeat lipid panel and LFTs in about 2 months.  Arrange follow-up visit with me on 02/28/2024 after cardiac catheterization and can order labs at that time.  Advised patient to take it easy until his cardiac catheterization and reviewed ED precautions.  Shared Decision Making/Informed Consent{ The risks [stroke (1 in 1000), death (1 in 1000), kidney failure [usually temporary] (1 in 500), bleeding (1 in 200), allergic reaction [possibly serious] (1 in 200)], benefits (diagnostic support and management of coronary artery disease) and alternatives of a cardiac catheterization were discussed in detail with Mr. Challis and he is willing to proceed.     Sapphire Tygart E Devlyn Retter, PA-C 02/13/2024 5:27 PM

## 2024-01-31 NOTE — Progress Notes (Addendum)
 Cardiology [facility]:    Date:  01/31/2024   ID:  Ian Ritter, DOB February 09, 1941, MRN [id number]  PCP:  Gerome Brunet, DO  Cardiologist:  Darryle ONEIDA Decent, MD Cardiology APP:  Rose Hegner E, PA-C     Referring MD: Gerome Brunet, DO   Chief Complaint: chest pain  History of Present Illness:    Ian Ritter is a 83 y.o. male with a history of hypertension, hyperlipidemia, transient thyrotoxicosis, CKD stage IIIa, and diverticulitis s/p resection and anastomosis in 2007, and IBS who is presents today as a new patient in the [facility] for further evaluation of chest pain.  Patient was recently seen by PCP on 01/30/2024 and reported chest tightness with exertion.  Therefore, he was referred to Cardiology for further evaluation.  He presents today with his wife.  He reports exertional chest pain for the past 3-4 months that typically occurs after he has been walking briskly or when walking up an incline for about 5 -10 minutes.  He describes this as a burning sensation that will radiate to his arms and his neck if he does not stop.  Symptoms will resolve quickly once he stops and rests.  He describes getting a little winded with these activities as well but states this is not new.  He denies any significant shortness of breath.  Otherwise, no cardiac complaints.  No orthopnea, PND, edema, palpitations, lightheadedness/dizziness, syncope.  He does have a family history of cardiovascular disease.  His father had hypertension and stroke.  His mother had CHF in her 2s.  He denies any history of tobacco use.  Reviewed recent labs from PCP's office on 01/23/2024: - CBC: WBC 6.4, Hgb 13.7, Plts 187 - CMET: Na 140, K 4.0, Glucose 97, BUN 19, Cr 1.44 (eGFR 48), Albumin 4.3, AST 22, ALT 18, Alk Phos 105, Total Bili 0.6 - TSH 0.674 (normal) - PSA elevated at 6.8  - Lipid panel from 06/2023: Total Cholesterol 170, Triglycerides 142, HLD 34, LDL 111 - Hemoglobin A1c from  06/2023: 5.5%  EKGs/Labs/Other Studies Reviewed:    The following studies were reviewed:  N/A  EKG:  EKG ordered today.   EKG Interpretation Date/Time:  Wednesday January 31 2024 14:55:02 EDT Ventricular Rate:  61 PR Interval:  190 QRS Duration:  136 QT Interval:  454 QTC Calculation: 457 R Axis:   47  Text Interpretation: Normal sinus rhythm Right bundle branch block  T wave inversions in inferior leads Confirmed by Jadine Patient [phone]) on 01/31/2024 3:20:56 PM    Recent Labs: No results found for requested labs within last 365 days.  Recent Lipid Panel No results found for: CHOL, TRIG, HDL, CHOLHDL, VLDL, LDLCALC, LDLDIRECT  Physical Exam:    Vital Signs: BP (!) 146/90 (BP Location: Right Arm, Patient Position: Sitting, Cuff Size: Normal)   Pulse 61   Ht 5' 11 (1.803 m)   SpO2 97%     Wt Readings from Last 3 Encounters:  04/26/12 226 lb 12.8 oz (102.9 kg)  12/21/11 221 lb (100.2 kg)  11/26/11 227 lb 11.2 oz (103.3 kg)     General: 83 y.o. Caucasian male in no acute distress. HEENT: Normocephalic and atraumatic. Sclera clear.  Neck: Supple. No carotid bruits. No JVD. Heart: RRR. Distinct S1 and S2. No murmurs, gallops, or rubs.  Lungs: No increased work of breathing. Clear to ausculation bilaterally. No wheezes, rhonchi, or rales.  Extremities: No lower extremity edema.   Skin: Warm and dry.  Neuro: No focal deficits. Psych: Normal affect. Responds appropriately.   Assessment:    1. Chest pain on exertion   2. RBBB   3. Primary hypertension   4. Hyperlipidemia, unspecified hyperlipidemia type   5. Stage 3a chronic kidney disease (HCC)     Plan:    Chest Pain RBBB Patient reports new exertional chest pain over the last 3 to 4 months that sounds like typical angina - EKG today shows normal sinus rhythm with a RBBB and T wave inversions in inferior leads. - He recently started taking Aspirin 81mg  daily. Agree with this. - Will provide  prescription of PRN sublingual Nitroglycerin. Educated patient on how to use this. - Will order Echo to assesses for structural heart disease.  - Will order coronary CTA to assess for obstructive CAD. Recent BMET from PCP's office on 01/23/2024 showed creatinine of 1.44 (eGFR of 48). Already on twice daily Lopressor  and heart rate in appropriate range.    Hypertension BP elevated in the office today. Initially 146/90 and then 160/92 on my personal recheck at the end of visit. However, patient states he monitors his BP at home and it is usually well controlled. - Continue current medications: Lisonopril 20mg  daily and Lopressor  50mg  twice daily.  - Asked patient to let us  know if BP consistently above goal >130/80 at home at which point we would need to adjust medications.  Hyperlipidemia Lipid panel in 06/2023 at PCP's office: Total Cholesterol 170, Triglycerides 142, HDL 34, LDL 111.  - Will wait for results of coronary CTA before deciding on what statin/ dose is needed.   CKD Stage IIIa Baseline creatinine looks to be around 1.2 to 1.5 over the last couple of years.   Disposition: Follow up in 2-3 months (sooner if coronary CTA shows concerning findings). Will have patient get established with Dr. Barbaraann who is one of the DODs today.    Signed, Aline FORBES Door, PA-C  01/31/2024 5:22 PM    [facility]  ADDENDUM 02/13/2024: Coronary CTA showed a coronary calcium score 376 (42nd percentile for age and sex) with mild focal calcified plaque in the proximal LAD immediately followed by a moderate soft plaque that extends into a severe calcified plaque within noted blooming artifact as well as moderate soft plaque in the proximal portion of the PDA.  FFR showed  that the LAD lesion was hemodynamically significant.  Cardiac catheterization was recommended.  I personally called and reviewed the results of the coronary CTA with patient today and reviewed recommendations for cardiac  catheterization.  He is agreeable to this.  LHC has been scheduled for 02/19/2024 with Dr. Mady.  He is already on aspirin 81 mg daily.  Will add Lipitor 80 mg daily.  He will then need repeat lipid panel and LFTs in about 2 months.  Arrange follow-up visit with me on 02/28/2024 after cardiac catheterization and can order labs at that time.  Advised patient to take it easy until his cardiac catheterization and reviewed ED precautions.  Shared Decision Making/Informed Consent{ The risks [stroke (1 in 1000), death (1 in 1000), kidney failure [usually temporary] (1 in 500), bleeding (1 in 200), allergic reaction [possibly serious] (1 in 200)], benefits (diagnostic support and management of coronary artery disease) and alternatives of a cardiac catheterization were discussed in detail with Ian Ritter and he is willing to proceed.     Sapphire Tygart E Devlyn Retter, PA-C 02/13/2024 5:27 PM

## 2024-02-08 ENCOUNTER — Encounter (HOSPITAL_COMMUNITY): Payer: Self-pay

## 2024-02-12 ENCOUNTER — Ambulatory Visit (HOSPITAL_COMMUNITY)
Admission: RE | Admit: 2024-02-12 | Discharge: 2024-02-12 | Disposition: A | Discharge status: Home / Self Care | Source: Ambulatory Visit | Attending: Student | Admitting: Student

## 2024-02-12 ENCOUNTER — Ambulatory Visit (HOSPITAL_BASED_OUTPATIENT_CLINIC_OR_DEPARTMENT_OTHER)
Admission: RE | Admit: 2024-02-12 | Discharge: 2024-02-12 | Disposition: A | Discharge status: Home / Self Care | Source: Ambulatory Visit | Attending: Cardiology | Admitting: Cardiology

## 2024-02-12 ENCOUNTER — Other Ambulatory Visit: Payer: Self-pay | Admitting: Cardiology

## 2024-02-12 DIAGNOSIS — I2510 Atherosclerotic heart disease of native coronary artery without angina pectoris: Secondary | ICD-10-CM

## 2024-02-12 DIAGNOSIS — R079 Chest pain, unspecified: Secondary | ICD-10-CM | POA: Insufficient documentation

## 2024-02-12 DIAGNOSIS — R931 Abnormal findings on diagnostic imaging of heart and coronary circulation: Secondary | ICD-10-CM | POA: Diagnosis not present

## 2024-02-12 DIAGNOSIS — I2584 Coronary atherosclerosis due to calcified coronary lesion: Secondary | ICD-10-CM | POA: Insufficient documentation

## 2024-02-12 MED ORDER — IOHEXOL 350 MG/ML SOLN
95.0000 mL | Freq: Once | INTRAVENOUS | Status: AC | PRN
  Administered 2024-02-12: 95 mL via INTRAVENOUS

## 2024-02-12 MED ORDER — NITROGLYCERIN 0.4 MG SL SUBL
0.8000 mg | SUBLINGUAL_TABLET | Freq: Once | SUBLINGUAL | Status: AC
  Administered 2024-02-12: 0.8 mg via SUBLINGUAL

## 2024-02-13 ENCOUNTER — Ambulatory Visit: Payer: Self-pay | Admitting: Student

## 2024-02-13 MED ORDER — ATORVASTATIN CALCIUM 80 MG PO TABS: 80.0000 mg | ORAL_TABLET | Freq: Every day | ORAL | 2 refills | Status: DC

## 2024-02-13 NOTE — Addendum Note (Signed)
 Addended by: Marliss Buttacavoli on: 02/13/2024 05:28 PM   Modules accepted: Orders

## 2024-02-14 ENCOUNTER — Telehealth: Payer: Self-pay | Admitting: *Deleted

## 2024-02-14 NOTE — Telephone Encounter (Signed)
 Cardiac Catheterization scheduled at [facility] for: Monday February 19, 2024 9 AM Arrival time [facility] Main Entrance A at: 7 AM  Diet: -Nothing to eat after midnight.  Hydration: -May drink clear liquids until 2 hours before the procedure.  Approved liquids: Water, clear tea, black coffee, fruit juices-non-citric and without pulp,Gatorade, plain Jello/popsicles.   -Please drink 16  oz of water 2 hours before procedure.  Medication instructions: -Hold:  Lisinopril-day before and day of procedure-per protocol GFR ,60 (48) -Other usual morning medications can be taken including aspirin 81 mg.  Plan to go home the same day, you will only stay overnight if medically necessary.  You must have responsible adult to drive you home.  Someone must be with you the first 24 hours after you arrive home.  Reviewed procedure instructions with patient

## 2024-02-14 NOTE — Telephone Encounter (Signed)
 01/23/24 CMP/CBCd [facility] DXA: eGFR 48-Cr 1.44-K 4

## 2024-02-16 NOTE — Progress Notes (Unsigned)
 Cardiology Office Note:    Date:  02/28/2024   ID:  Ian Ritter, DOB 1940-10-18, MRN [id number]  PCP:  Gerome Brunet, DO  Cardiologist:  Darryle ONEIDA Decent, MD Cardiology APP:  Tynan Boesel E, PA-C     Referring MD: Gerome Brunet, DO   Chief Complaint: follow-up of recent PCI  History of Present Illness:    Ian Ritter is a 83 y.o. male with a history of CAD s/p DES to LAD on 02/19/2024, complete heart block, hypertension, hyperlipidemia, transient thyrotoxicosis, CKD stage IIIa, diverticulitis s/p resection and anastomosis in 2007, IBS, and lung nodules who presents today for follow-up of recent cardiac catheterization.   Patient was first seen by me on 01/31/2024 in [facility] for exertional chest pain over the last 3-4 months. Coronary CTA was ordered and showed a coronary calcium score of 376 (42nd percentile for age and sex) with severe stenosis of the proximal LAD. This led to a cardiac catheterization on 02/19/2024 which showed severe single vessel CAD with sequential 80-90% and heavy calcified 50% stenosis of the proximal to mid LAD. Otherwise, coronaries were normal. He underwent successful IVUS-guided PCI with DES to the LAD. Procedure was complicated by development of complete heart block when crossing the aortic valve which persisted throughout the case. Therefore, home Metoprolol  was stopped and patient was admitted. Echo showed LVEF of 60-65% with normal wall motion and mild LVH, mildly enlarged RV with normal RV function, and no significant valvular disease. He continued to have complete heart block with intermittent 2:1 AV block so EP was consulted. It was felt that he most likely had trauma to his conduction system when crossing the aortic valve. Plan was to continue to hold Metoprolol  but he was not felt to need a permanent pacemaker (situation complicated by the fact that he need uninterrupted DAPT for 6 months after PCI). He was discharged on 02/21/2024 with  a live Zio monitor.  Lisinopril was stopped on discharge due to mild increase in creatinine and he was started on Amlodipine instead.   Patient presents today for follow-up. He is here with his wife. Overall, he is doing well. He reports some mild shortness of breath if he pushes himself too far but no other issues. No short of breath at rest. No chest pain,orthopnea, PND, lower extremity edema, palpitations, lightheadedness, dizziness, syncope. He is compliant with his medications and is tolerated them well.  He continues to have episodes of intermittent complete heart block. Our office has received multiple alerts from the monitor company about intermittent complete heart block. Suspect his mild dyspnea on exertion may be due to his conduction disease but he is otherwise asymptomatic with this. 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 He states there was a problem with the first monitor so he was without one for a couple of days. The replacement monitor arrived 2 days ago and he has it on today.    EKGs/Labs/Other Studies Reviewed:    The following studies were reviewed:  Coronary CTA 02/12/2024: Impression: 1. Coronary calcium score of 376. This was 4 percentile for age and sex matched control. 2. Normal coronary origin with right dominance. 3. CAD-RADS 4 Severe stenosis. (70-99% or > 50% left main). Cardiac catheterization is recommended. CT FFR will also be ordered. In addition, consider symptom-guided anti-ischemic pharmacotherapy as well as risk factor modification per guideline directed care. _______________  Left Cardiac Catheterization 02/19/2024: Conclusions:  Severe single-vessel coronary artery disease with sequential 80-90% and heavily calcified 50% proximal/mid LAD stenoses.  LCx and RCA are without significant disease. Mildly elevated left ventricular filling pressure (LVEDP 20 mmHg). Precipitation of complete heart  block when crossing the aortic valve.  This persisted throughout the case with an underlying junctional escape rhythm (patient has RBBB at baseline). Successful IVUS-guided PCI to the proximal/mid LAD using a Synergy XD 3.0 x 28 mm drug-eluting stent with 0% residual stenosis and TIMI-3 flow.   Recommendations: Overnight observation in the setting of continued complete heart block following PCI to the LAD.  If heart block does not improve with washout of metoprolol , EP consultation will need to be considered prior to discharge. Dual antiplatelet therapy with aspirin and clopidogrel for at least 6 months. Continue aggressive secondary prevention of coronary artery disease. Obtain echocardiogram.  Diagnostic Dominance: Right  Intervention    _______________  Echocardiogram 02/20/2024: Impressions: 1. Indeteriminate diastology- appears to have 2:1 heart block on spectral  Doppler- clinical correlation and telemetry assessment advised. Left  ventricular ejection fraction, by estimation, is 60 to 65%. The left  ventricle has normal function. The left  ventricle has no regional wall motion abnormalities. There is mild left  ventricular hypertrophy. Left ventricular diastolic parameters are  indeterminate.   2. Right ventricular systolic function is normal. The right ventricular  size is mildly enlarged.   3. The mitral valve is normal in structure. No evidence of mitral valve  regurgitation. No evidence of mitral stenosis.   4. The aortic valve is tricuspid. Aortic valve regurgitation is trivial.  No aortic stenosis is present.   5. The inferior vena cava is normal in size with greater than 50%  respiratory variability, suggesting right atrial pressure of 3 mmHg.   Comparison(s): No prior Echocardiogram.    EKG:  EKG ordered today.   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 (Wenckebach) Artifact in V1 T wave inversions in lead III and mild ST depressions in lead V2-V3 When compared with ECG of 20-Feb-2024 05:45, No significant ischemic changes compared to prior tracings Confirmed by Emerick Weatherly [phone]) on 02/28/2024 2:12:06 PM    Recent Labs: 02/20/2024: BUN 15; Creatinine, Ser 1.58; Hemoglobin 12.5; Platelets 187; Potassium 3.7; Sodium 141  Recent Lipid Panel No results found for: CHOL, TRIG, HDL, CHOLHDL, VLDL, LDLCALC, LDLDIRECT  Physical Exam:    Vital Signs: BP 126/64   Pulse (!) 49   Ht 5' 11 (1.803 m)   Wt 216 lb (98 kg)   SpO2 94%   BMI 30.13 kg/m     Wt Readings from Last 3 Encounters:  02/28/24 216 lb (98 kg)  02/19/24 210 lb 14.4 oz (95.7 kg)  01/31/24 220 lb (99.8 kg)     General: 83 y.o. male in no acute distress. HEENT: Normocephalic and atraumatic. Sclera clear.  Neck: Supple. No carotid bruits. No JVD. Heart:  Bradycardic with regular rhythm. No murmurs, gallops, or rubs.  Lungs: No increased work of breathing. Clear to ausculation bilaterally. No wheezes, rhonchi, or rales.  Extremities: No lower extremity edema. Right radial cath site with mild resolving ecchymosis but soft and stable with no signs of hematoma.  Skin: Warm and dry. Neuro: No focal deficits. Psych:  Normal affect. Responds appropriately.  Assessment:    1. Coronary artery disease involving native coronary artery of native heart without angina pectoris   2. Heart block AV complete (HCC)   3. Primary hypertension   4. Hyperlipidemia, unspecified hyperlipidemia type   5. CKD stage 3a, GFR 45-59 ml/min (HCC)     Plan:    CAD S/p recent DES to LAD on 02/19/2024.  - No recurrent chest pain.  - Continue DAPT with Aspirin 81mg  daily and Plavix 75mg  daily.  - Continue Lipitor 80mg  daily.    Complete Heart Block Recent LHC on 02/19/2024 was complicated by complete heart block when crossing the aortic valve which persistent following procedure. EP consulted and felt to likely due to trauma to his conduction system when crossing the valve. Home Metoprolol  was stopped but he was not felt to need a permanent pacemaker (situation complicated by the fact that he need uninterrupted DAPT for 6 months after PCI). 2 week live Zio monitor was placed at discharged and has continued to show intermittent complete heart block.  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 - Asymptomatic with this.  - He is still wearing the live Zio monitor.  - Continue to avoid AV nodal agents.  - Follow-up with EP on 03/21/2024 as scheduled. Recommended no driving until he sees EP. Discussed Ian precautions.   Hypertension BP well controlled. - Continue Amlodipine 5mg  daily.  - Previously on Lisinopril but this was held during recent admission due to renal function.  Hyperlipidemia Lipid panel in 06/2023 at PCP's office: Total Cholesterol 170, Triglycerides 142, HDL 34, LDL 111. Lipoprotein (a) 17.1 on 02/20/2024. LDL goal <70.  - He was started on Lipitor 80mg  daily after abnormal coronary CTA results in 01/2024. Continue.  - Will need repeat lipid panel and LFTs in 2 months.   CKD Stage IIIa Baseline creatinine around 1.3 to 1.5. Most recent creatinine 1.58 after recent cath.  - Will repeat BMET today.  Lung Nodule Recent coronary CTA showed a stable 5mm size non-calcified nodule in the right lower lobs that was unchanged from prior imaging in 2013 but there were 2 new adjacent non-calcified nodules in the right lower lobe.  - He denies any history of tobacco use. - Consider repeat CT scan in 1 year. This can be done through us  or PCP.  Possible Thoracic Vertebrae Fracture Recent coronary CTA also  incidentally showed a mild superior endplate  compression deformity of lower thoracic vertebrae and age indeterminate fracture of the anterior bridging osteophyte/anterosuperior corner of the vertebral body.  - No reports of pain. Therefore, I don't think any dedicated imaging of the thoracic spine is necessary.   Disposition: Patient already has follow-up with Daphne Barrack, NP, scheduled for 03/21/2024 and Dr. Barbaraann scheduled for 05/08/2023.   Signed, Aline FORBES Door, PA-C  02/28/2024 4:48 PM    [facility]

## 2024-02-19 ENCOUNTER — Inpatient Hospital Stay (HOSPITAL_COMMUNITY)
Admission: RE | Admit: 2024-02-19 | Discharge: 2024-02-21 | DRG: 322 | Disposition: A | Discharge status: Home / Self Care | Attending: Internal Medicine | Admitting: Internal Medicine

## 2024-02-19 ENCOUNTER — Other Ambulatory Visit: Payer: Self-pay

## 2024-02-19 ENCOUNTER — Observation Stay (HOSPITAL_COMMUNITY)

## 2024-02-19 ENCOUNTER — Encounter (HOSPITAL_COMMUNITY): Admission: RE | Disposition: A | Payer: Self-pay | Source: Home / Self Care | Attending: Internal Medicine

## 2024-02-19 ENCOUNTER — Encounter (HOSPITAL_COMMUNITY): Payer: Self-pay | Admitting: Internal Medicine

## 2024-02-19 DIAGNOSIS — I498 Other specified cardiac arrhythmias: Secondary | ICD-10-CM | POA: Diagnosis present

## 2024-02-19 DIAGNOSIS — Z955 Presence of coronary angioplasty implant and graft: Principal | ICD-10-CM

## 2024-02-19 DIAGNOSIS — I4510 Unspecified right bundle-branch block: Secondary | ICD-10-CM | POA: Diagnosis present

## 2024-02-19 DIAGNOSIS — Z7982 Long term (current) use of aspirin: Secondary | ICD-10-CM

## 2024-02-19 DIAGNOSIS — I25119 Atherosclerotic heart disease of native coronary artery with unspecified angina pectoris: Secondary | ICD-10-CM | POA: Diagnosis not present

## 2024-02-19 DIAGNOSIS — Z79899 Other long term (current) drug therapy: Secondary | ICD-10-CM

## 2024-02-19 DIAGNOSIS — E785 Hyperlipidemia, unspecified: Secondary | ICD-10-CM | POA: Diagnosis present

## 2024-02-19 DIAGNOSIS — I442 Atrioventricular block, complete: Secondary | ICD-10-CM | POA: Diagnosis present

## 2024-02-19 DIAGNOSIS — I129 Hypertensive chronic kidney disease with stage 1 through stage 4 chronic kidney disease, or unspecified chronic kidney disease: Secondary | ICD-10-CM | POA: Diagnosis present

## 2024-02-19 DIAGNOSIS — Z823 Family history of stroke: Secondary | ICD-10-CM

## 2024-02-19 DIAGNOSIS — Z8249 Family history of ischemic heart disease and other diseases of the circulatory system: Secondary | ICD-10-CM

## 2024-02-19 DIAGNOSIS — N1831 Chronic kidney disease, stage 3a: Secondary | ICD-10-CM | POA: Diagnosis present

## 2024-02-19 DIAGNOSIS — I10 Essential (primary) hypertension: Secondary | ICD-10-CM | POA: Insufficient documentation

## 2024-02-19 HISTORY — PX: CORONARY STENT INTERVENTION: CATH118234

## 2024-02-19 HISTORY — PX: CORONARY ULTRASOUND/IVUS: CATH118244

## 2024-02-19 HISTORY — PX: LEFT HEART CATH AND CORONARY ANGIOGRAPHY: CATH118249

## 2024-02-19 LAB — CBC
HCT: 44.1 % (ref 39.0–52.0)
Hemoglobin: 14.4 g/dL (ref 13.0–17.0)
MCH: 33.1 pg (ref 26.0–34.0)
MCHC: 32.7 g/dL (ref 30.0–36.0)
MCV: 101.4 fL — ABNORMAL HIGH (ref 80.0–100.0)
Platelets: 235 K/uL (ref 150–400)
RBC: 4.35 MIL/uL (ref 4.22–5.81)
RDW: 13.4 % (ref 11.5–15.5)
WBC: 7.9 K/uL (ref 4.0–10.5)
nRBC: 0 % (ref 0.0–0.2)

## 2024-02-19 LAB — BASIC METABOLIC PANEL WITH GFR
Anion gap: 12 (ref 5–15)
BUN: 15 mg/dL (ref 8–23)
CO2: 26 mmol/L (ref 22–32)
Calcium: 9.8 mg/dL (ref 8.9–10.3)
Chloride: 100 mmol/L (ref 98–111)
Creatinine, Ser: 1.37 mg/dL — ABNORMAL HIGH (ref 0.61–1.24)
GFR, Estimated: 51 mL/min — ABNORMAL LOW (ref >60)
Glucose, Bld: 109 mg/dL — ABNORMAL HIGH (ref 70–99)
Potassium: 3.5 mmol/L (ref 3.5–5.1)
Sodium: 138 mmol/L (ref 135–145)

## 2024-02-19 LAB — POCT ACTIVATED CLOTTING TIME
Activated Clotting Time: 239 s
Activated Clotting Time: 274 s
Activated Clotting Time: 285 s
Activated Clotting Time: 297 s

## 2024-02-19 MED ORDER — ONDANSETRON HCL 4 MG/2ML IJ SOLN: 4.0000 mg | Freq: Four times a day (QID) | INTRAMUSCULAR | Status: DC | PRN

## 2024-02-19 MED ORDER — POLYETHYLENE GLYCOL 3350 17 GM/SCOOP PO POWD: 17.0000 g | Freq: Every day | ORAL | Status: DC

## 2024-02-19 MED ORDER — MIDAZOLAM HCL (PF) 2 MG/2ML IJ SOLN
INTRAMUSCULAR | Status: DC | PRN
  Administered 2024-02-19: 1 mg via INTRAVENOUS

## 2024-02-19 MED ORDER — ASPIRIN 81 MG PO CHEW: 81.0000 mg | CHEWABLE_TABLET | ORAL | Status: DC

## 2024-02-19 MED ORDER — VERAPAMIL HCL 2.5 MG/ML IV SOLN
INTRAVENOUS | Status: DC | PRN
  Administered 2024-02-19: 10 mL via INTRA_ARTERIAL

## 2024-02-19 MED ORDER — FREE WATER: 500.0000 mL | Freq: Once | Status: DC

## 2024-02-19 MED ORDER — SODIUM CHLORIDE 0.9% FLUSH
3.0000 mL | Freq: Two times a day (BID) | INTRAVENOUS | Status: DC
  Administered 2024-02-19 – 2024-02-21 (×4): 3 mL via INTRAVENOUS

## 2024-02-19 MED ORDER — SODIUM CHLORIDE 0.9% FLUSH: 3.0000 mL | INTRAVENOUS | Status: DC | PRN

## 2024-02-19 MED ORDER — FENTANYL CITRATE (PF) 100 MCG/2ML IJ SOLN
INTRAMUSCULAR | Status: DC | PRN
  Administered 2024-02-19: 25 ug via INTRAVENOUS

## 2024-02-19 MED ORDER — HEPARIN SODIUM (PORCINE) 1000 UNIT/ML IJ SOLN
INTRAMUSCULAR | Status: DC | PRN
Start: 2024-02-19 — End: 2024-02-19
  Administered 2024-02-19: 5000 [IU] via INTRAVENOUS
  Administered 2024-02-19: 4000 [IU] via INTRAVENOUS
  Administered 2024-02-19: 2000 [IU] via INTRAVENOUS
  Administered 2024-02-19: 5000 [IU] via INTRAVENOUS
  Administered 2024-02-19: 2000 [IU] via INTRAVENOUS

## 2024-02-19 MED ORDER — LISINOPRIL 20 MG PO TABS: 20.0000 mg | ORAL_TABLET | Freq: Every day | ORAL | Status: DC

## 2024-02-19 MED ORDER — CLOPIDOGREL BISULFATE 75 MG PO TABS
75.0000 mg | ORAL_TABLET | Freq: Every day | ORAL | Status: DC
  Administered 2024-02-20 – 2024-02-21 (×2): 75 mg via ORAL
  Filled 2024-02-19 (×2): qty 1

## 2024-02-19 MED ORDER — NITROGLYCERIN 1 MG/10 ML FOR IR/CATH LAB
INTRA_ARTERIAL | Status: DC | PRN
  Administered 2024-02-19: 200 ug via INTRACORONARY
  Administered 2024-02-19: 100 ug via INTRACORONARY

## 2024-02-19 MED ORDER — SODIUM CHLORIDE 0.9 % IV SOLN: 250.0000 mL | INTRAVENOUS | Status: AC | PRN

## 2024-02-19 MED ORDER — CLOPIDOGREL BISULFATE 300 MG PO TABS
ORAL_TABLET | ORAL | Status: DC | PRN
  Administered 2024-02-19: 600 mg via ORAL

## 2024-02-19 MED ORDER — SODIUM CHLORIDE 0.9% FLUSH: 3.0000 mL | Freq: Two times a day (BID) | INTRAVENOUS | Status: DC

## 2024-02-19 MED ORDER — NITROGLYCERIN 1 MG/10 ML FOR IR/CATH LAB
INTRA_ARTERIAL | Status: AC
  Filled 2024-02-19: qty 10

## 2024-02-19 MED ORDER — MIDAZOLAM HCL 2 MG/2ML IJ SOLN
INTRAMUSCULAR | Status: AC
  Filled 2024-02-19: qty 2

## 2024-02-19 MED ORDER — IOHEXOL 350 MG/ML SOLN
INTRAVENOUS | Status: DC | PRN
  Administered 2024-02-19: 126 mL

## 2024-02-19 MED ORDER — ASPIRIN 81 MG PO TBEC
81.0000 mg | DELAYED_RELEASE_TABLET | Freq: Every day | ORAL | Status: DC
  Administered 2024-02-20 – 2024-02-21 (×2): 81 mg via ORAL
  Filled 2024-02-19 (×2): qty 1

## 2024-02-19 MED ORDER — HEPARIN SODIUM (PORCINE) 1000 UNIT/ML IJ SOLN
INTRAMUSCULAR | Status: AC
Start: 2024-02-19 — End: 2024-02-19
  Filled 2024-02-19: qty 10

## 2024-02-19 MED ORDER — ASPIRIN 81 MG PO CAPS: 81.0000 mg | ORAL_CAPSULE | Freq: Every day | ORAL | Status: DC

## 2024-02-19 MED ORDER — LIDOCAINE HCL (PF) 1 % IJ SOLN
INTRAMUSCULAR | Status: DC | PRN
  Administered 2024-02-19: 2 mL via INTRADERMAL

## 2024-02-19 MED ORDER — ENOXAPARIN SODIUM 40 MG/0.4ML IJ SOSY
40.0000 mg | PREFILLED_SYRINGE | INTRAMUSCULAR | Status: DC
  Administered 2024-02-20 – 2024-02-21 (×2): 40 mg via SUBCUTANEOUS
  Filled 2024-02-19 (×2): qty 0.4

## 2024-02-19 MED ORDER — CLOPIDOGREL BISULFATE 300 MG PO TABS
ORAL_TABLET | ORAL | Status: AC
  Filled 2024-02-19: qty 2

## 2024-02-19 MED ORDER — SODIUM CHLORIDE 0.9 % IV SOLN: 250.0000 mL | INTRAVENOUS | Status: DC | PRN

## 2024-02-19 MED ORDER — FENTANYL CITRATE (PF) 100 MCG/2ML IJ SOLN
INTRAMUSCULAR | Status: AC
  Filled 2024-02-19: qty 2

## 2024-02-19 MED ORDER — HEPARIN (PORCINE) IN NACL 1000-0.9 UT/500ML-% IV SOLN
INTRAVENOUS | Status: DC | PRN
  Administered 2024-02-19 (×2): 500 mL

## 2024-02-19 MED ORDER — VERAPAMIL HCL 2.5 MG/ML IV SOLN
INTRAVENOUS | Status: AC
  Filled 2024-02-19: qty 2

## 2024-02-19 MED ORDER — NITROGLYCERIN 0.4 MG SL SUBL
0.4000 mg | SUBLINGUAL_TABLET | SUBLINGUAL | Status: DC | PRN
Start: 2024-02-19 — End: 2024-02-21

## 2024-02-19 MED ORDER — ACETAMINOPHEN 325 MG PO TABS
650.0000 mg | ORAL_TABLET | ORAL | Status: DC | PRN
  Administered 2024-02-20: 650 mg via ORAL
  Filled 2024-02-19: qty 2

## 2024-02-19 MED ORDER — ATORVASTATIN CALCIUM 80 MG PO TABS
80.0000 mg | ORAL_TABLET | Freq: Every day | ORAL | Status: DC
  Administered 2024-02-19 – 2024-02-21 (×3): 80 mg via ORAL
  Filled 2024-02-19 (×3): qty 1

## 2024-02-19 MED ORDER — HYDRALAZINE HCL 20 MG/ML IJ SOLN: 10.0000 mg | INTRAMUSCULAR | Status: AC | PRN

## 2024-02-19 MED ORDER — POLYETHYLENE GLYCOL 3350 17 G PO PACK
17.0000 g | PACK | Freq: Every day | ORAL | Status: DC
  Administered 2024-02-21: 17 g via ORAL
  Filled 2024-02-19: qty 1

## 2024-02-19 MED ORDER — FREE WATER: 250.0000 mL | Freq: Once | Status: DC

## 2024-02-19 MED ORDER — HEPARIN SODIUM (PORCINE) 1000 UNIT/ML IJ SOLN
INTRAMUSCULAR | Status: AC
  Filled 2024-02-19: qty 10

## 2024-02-19 MED ORDER — LIDOCAINE HCL (PF) 1 % IJ SOLN
INTRAMUSCULAR | Status: AC
  Filled 2024-02-19: qty 30

## 2024-02-19 MED ORDER — SIMETHICONE 80 MG PO CHEW
80.0000 mg | CHEWABLE_TABLET | Freq: Four times a day (QID) | ORAL | Status: DC | PRN
Start: 2024-02-19 — End: 2024-02-21

## 2024-02-19 NOTE — Progress Notes (Signed)
 [employer] implemented

## 2024-02-19 NOTE — Interval H&P Note (Signed)
 History and Physical Interval Note:  02/19/2024 9:45 AM  Ian Ritter  has presented today for surgery, with the diagnosis of coronary artery disease with angina pectoris and abnormal cardiac CTA.  The various methods of treatment have been discussed with the patient and family. After consideration of risks, benefits and other options for treatment, the patient has consented to  Procedure(s): LEFT HEART CATH AND CORONARY ANGIOGRAPHY (N/A) as a surgical intervention.  The patient's history has been reviewed, patient examined, no change in status, stable for surgery.  I have reviewed the patient's chart and labs.  Questions were answered to the patient's satisfaction.    Cath Lab Visit (complete for each Cath Lab visit)  Clinical Evaluation Leading to the Procedure:   ACS: No.  Non-ACS:    Anginal Classification: CCS II  Anti-ischemic medical therapy: Minimal Therapy (1 class of medications)  Non-Invasive Test Results: Cardiac CTA with significant proximal LAD disease -> intermediate-high risk  Prior CABG: No previous CABG  Ian Ritter

## 2024-02-19 NOTE — Progress Notes (Signed)
 Pt heart block & brady into the 30's on tele. Gail MD updated via page.   Pt has had no changes, has no complaints at this time. Received in report that EP would be consulted.   No events overnight

## 2024-02-19 NOTE — Plan of Care (Signed)

## 2024-02-20 ENCOUNTER — Observation Stay (HOSPITAL_COMMUNITY)

## 2024-02-20 ENCOUNTER — Encounter (HOSPITAL_COMMUNITY): Payer: Self-pay | Admitting: Internal Medicine

## 2024-02-20 DIAGNOSIS — E785 Hyperlipidemia, unspecified: Secondary | ICD-10-CM | POA: Diagnosis present

## 2024-02-20 DIAGNOSIS — I442 Atrioventricular block, complete: Secondary | ICD-10-CM

## 2024-02-20 DIAGNOSIS — I25119 Atherosclerotic heart disease of native coronary artery with unspecified angina pectoris: Secondary | ICD-10-CM | POA: Diagnosis present

## 2024-02-20 DIAGNOSIS — Z8249 Family history of ischemic heart disease and other diseases of the circulatory system: Secondary | ICD-10-CM | POA: Diagnosis not present

## 2024-02-20 DIAGNOSIS — I4510 Unspecified right bundle-branch block: Secondary | ICD-10-CM | POA: Diagnosis present

## 2024-02-20 DIAGNOSIS — Z7982 Long term (current) use of aspirin: Secondary | ICD-10-CM | POA: Diagnosis not present

## 2024-02-20 DIAGNOSIS — Z823 Family history of stroke: Secondary | ICD-10-CM | POA: Diagnosis not present

## 2024-02-20 DIAGNOSIS — I129 Hypertensive chronic kidney disease with stage 1 through stage 4 chronic kidney disease, or unspecified chronic kidney disease: Secondary | ICD-10-CM | POA: Diagnosis present

## 2024-02-20 DIAGNOSIS — N1831 Chronic kidney disease, stage 3a: Secondary | ICD-10-CM | POA: Diagnosis present

## 2024-02-20 DIAGNOSIS — I498 Other specified cardiac arrhythmias: Secondary | ICD-10-CM | POA: Diagnosis present

## 2024-02-20 DIAGNOSIS — Z79899 Other long term (current) drug therapy: Secondary | ICD-10-CM | POA: Diagnosis not present

## 2024-02-20 LAB — CBC
HCT: 37.3 % — ABNORMAL LOW (ref 39.0–52.0)
Hemoglobin: 12.5 g/dL — ABNORMAL LOW (ref 13.0–17.0)
MCH: 33.2 pg (ref 26.0–34.0)
MCHC: 33.5 g/dL (ref 30.0–36.0)
MCV: 98.9 fL (ref 80.0–100.0)
Platelets: 187 K/uL (ref 150–400)
RBC: 3.77 MIL/uL — ABNORMAL LOW (ref 4.22–5.81)
RDW: 13.5 % (ref 11.5–15.5)
WBC: 6.8 K/uL (ref 4.0–10.5)
nRBC: 0 % (ref 0.0–0.2)

## 2024-02-20 LAB — BASIC METABOLIC PANEL WITH GFR
Anion gap: 11 (ref 5–15)
BUN: 15 mg/dL (ref 8–23)
CO2: 24 mmol/L (ref 22–32)
Calcium: 9.1 mg/dL (ref 8.9–10.3)
Chloride: 106 mmol/L (ref 98–111)
Creatinine, Ser: 1.58 mg/dL — ABNORMAL HIGH (ref 0.61–1.24)
GFR, Estimated: 43 mL/min — ABNORMAL LOW (ref >60)
Glucose, Bld: 95 mg/dL (ref 70–99)
Potassium: 3.7 mmol/L (ref 3.5–5.1)
Sodium: 141 mmol/L (ref 135–145)

## 2024-02-20 LAB — ECHOCARDIOGRAM COMPLETE
Area-P 1/2: 2.83 cm2
Height: 71 in
S' Lateral: 3 cm
Weight: 3374.4 [oz_av]

## 2024-02-20 NOTE — Progress Notes (Addendum)
 Progress Note  Patient Name: Ian Ritter Date of Encounter: 02/20/2024 [facility] Cardiologist: Darryle ONEIDA Decent, MD   Interval Summary    No complaints this morning. Echo tech at bedside.   Vital Signs Vitals:   02/19/24 1539 02/19/24 2100 02/20/24 0041 02/20/24 0519  BP: (!) 142/61 136/63 103/63 (!) 118/59  Pulse: (!) 38 (!) 41 (!) 37   Resp: 18 18 18 18   Temp:  97.6 F (36.4 C) 97.7 F (36.5 C) 98.4 F (36.9 C)  TempSrc:  Axillary Oral Oral  SpO2: 100% 100% 97% 96%  Weight:      Height:        Intake/Output Summary (Last 24 hours) at 02/20/2024 0902 Last data filed at 02/19/2024 1300 Gross per 24 hour  Intake 240 ml  Output 300 ml  Net -60 ml      02/19/2024    3:23 PM 02/19/2024    9:10 AM 01/31/2024    2:52 PM  Last 3 Weights  Weight (lbs) 210 lb 14.4 oz 210 lb 220 lb  Weight (kg) 95.664 kg 95.255 kg 99.791 kg      Telemetry/ECG   CHB, rates in the 40s - Personally Reviewed  Physical Exam  GEN: No acute distress.   Neck: No JVD Cardiac: RRR, no murmurs, rubs, or gallops.  Respiratory: Clear to auscultation bilaterally. GI: Soft, nontender, non-distended  MS: No edema VAS: right radial cath site stable   Assessment & Plan   83 y.o. male with a history of hypertension, hyperlipidemia, transient thyrotoxicosis, CKD stage IIIa, and diverticulitis s/p resection and anastomosis in 2007, and IBS who presented for outpatient cardiac cath.  Presented for a heart first day and seen by Aline Door, PA.  Underwent outpatient coronary CTA with significant stenosis in the proximal LAD.  Set up for outpatient cardiac catheterization.  CAD -- Underwent outpatient cardiac catheterization 11/3 with single-vessel CAD with 80 to 90% and heavily calcified 50% proximal/mid LAD stenosis treated with IVUS guided PCI/DES x 1.  Commendations for DAPT with aspirin/Plavix for at least 6 months -- No recurrent chest pain -- Echo pending  Complete heart  block -- Developed to complete heart block when crossing aortic valve followed by junctional escape rhythm with known right bundle branch block at baseline. -- Overnight but continues to have complete heart block with heart rates in the 40s, asymptomatic -- On metoprolol  50 mg twice daily PTA, last dose was yesterday morning -- will ask for EP consult   HLD -- Lipid panel in 06/2023 at PCP's office: Total Cholesterol 170, Triglycerides 142, HDL 34, LDL 111  -- started on atorvastatin 80mg  daily this admission -- will need FLP/LFTs in 8 weeks  CKD IIIa -- Cr 1.37>>1.58 -- hold lisinopril for now    For questions or updates, please contact [facility] Please consult [email or web address] for contact info under   Signed, Manuelita Rummer, NP   Patient seen, examined. Available data reviewed. Agree with findings, assessment, and plan as outlined by Manuelita Rummer, NP.  On my exam of the patient, he is alert, oriented, in no distress.  HEENT is normal, JVP is normal, lungs are clear bilaterally, heart is bradycardic and regular with no murmur gallop, abdomen is soft and nontender, radial cath site is clear, there is no lower extremity edema, skin is warm and dry without rash.  Telemetry shows complete AV block with heart rate in the 40s.  The patient is asymptomatic and it appears that he has  gone into complete AV block in the setting of a baseline right bundle branch block following cardiac catheterization and entry into the left ventricle with a catheter.  Agree with holding metoprolol , continuing telemetry monitoring, and requesting EP consultation.  Hopefully his conduction system will return to baseline over the next 24 hours.  Continue DAPT for his coronary artery disease and PCI procedure yesterday.  Otherwise, as outlined above.  Ozell Fell, M.D. 02/20/2024 12:17 PM

## 2024-02-20 NOTE — Plan of Care (Signed)

## 2024-02-20 NOTE — Consult Note (Addendum)
 ELECTROPHYSIOLOGY CONSULT NOTE    Patient ID: Ian Ritter MRN: [id number], DOB/AGE: [birth year] 83 y.o.  Admit date: 02/19/2024 Date of Consult: 02/20/2024  Primary Physician: Gerome Brunet, DO Primary Cardiologist: Darryle ONEIDA Decent, MD  Electrophysiologist: new   Referring Provider: @ATTENDING @  Patient Profile: Ian Ritter is a 83 y.o. male with a history of CAD s/p PCI (yesterday), HTN, HLD, transient thyrotoxicosis, CKD-3a, diverticulitis s/p resection & anastomosis (2007), IBS who is being seen today for the evaluation of CHB s/p PCI at the request of Dr. Decent.  HPI:  Ian Ritter is a 83 y.o. male presented to hospital 11/3 for outpatient LHC after coronary CTA showed significant stenosis in prox LAD. S/p IVUS guided PCI/DES x 1.  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 He continued to have CHB overnight, so EP consulted.  Currently, he is sitting up in chair at bedside. He denies chest pain, chest pressure. He did notice when he was up walking around the room earlier today that he had some mild SOB.   He is on 50mg  lopressor  BID at home, last dose 11/3 AM   Wife is at bedside   Labs Potassium3.7 (11/04 0406)   Creatinine, ser  1.58* (11/04 0406) PLT  187 (11/04 0406) HGB  12.5* (11/04 0406) WBC 6.8 (11/04 0406)  .    Past Medical History:  Diagnosis Date   Diverticulitis    Herniated disc    Hypertension      Surgical History:  Past Surgical History:  Procedure Laterality Date   BACK SURGERY     BOWEL RESECTION     CORONARY STENT INTERVENTION N/A 02/19/2024   Procedure: CORONARY STENT INTERVENTION;  Surgeon: Mady Bruckner, MD;  Location: [facility];  Service: Cardiovascular;  Laterality: N/A;   CORONARY ULTRASOUND/IVUS N/A 02/19/2024   Procedure: Coronary Ultrasound/IVUS;  Surgeon: Mady Bruckner, MD;  Location: [facility];  Service: Cardiovascular;  Laterality: N/A;    LEFT HEART CATH AND CORONARY ANGIOGRAPHY N/A 02/19/2024   Procedure: LEFT HEART CATH AND CORONARY ANGIOGRAPHY;  Surgeon: Mady Bruckner, MD;  Location: [facility];  Service: Cardiovascular;  Laterality: N/A;   VENTRAL HERNIA REPAIR  11/28/2011   Procedure: HERNIA REPAIR VENTRAL ADULT;  Surgeon: Sherlean JINNY Laughter, MD;  Location: [facility];  Service: General;  Laterality: N/A;     Medications Prior to Admission  Medication Sig Dispense Refill Last Dose/Taking   Aspirin 81 MG CAPS Take 81 mg by mouth daily.   02/19/2024 at  6:00 AM   atorvastatin (LIPITOR) 80 MG tablet Take 1 tablet (80 mg total) by mouth daily. 30 tablet 2 02/18/2024   Cholecalciferol (VITAMIN D) 50 MCG (2000 UT) tablet Take 2,000 Units by mouth daily.   02/18/2024   Coenzyme Q10 (COQ10) 200 MG CAPS Take 200 mg by mouth daily.   02/18/2024   Cyanocobalamin (B-12) 1000 MCG CAPS Take 1,000 mcg by mouth daily.   02/18/2024   lisinopril (PRINIVIL,ZESTRIL) 20 MG tablet Take 20 mg by mouth daily.   02/18/2024   metoprolol  (LOPRESSOR ) 50 MG tablet Take 50 mg by mouth 2 (two) times daily.   02/19/2024 Morning   Multiple Vitamins-Minerals (PRESERVISION AREDS 2 PO) Take 1 tablet by mouth daily.   02/18/2024   nitroGLYCERIN (NITROSTAT) 0.4 MG SL tablet Place 1 tablet (0.4 mg total) under the tongue every 5 (five) minutes as needed for chest pain. 25 tablet 2 Past Week  Omega-3 Fatty Acids (FISH OIL) 1000 MG CAPS Take 1,000 mg by mouth daily.   02/18/2024   polyethylene glycol powder (MIRALAX) 17 GM/SCOOP powder Take 17 g by mouth daily.   02/18/2024   simethicone (GAS-X) 80 MG chewable tablet Chew 80 mg by mouth every 6 (six) hours as needed for flatulence.   02/18/2024    Inpatient Medications:   aspirin EC  81 mg Oral Daily   atorvastatin  80 mg Oral Daily   clopidogrel  75 mg Oral Q breakfast   enoxaparin (LOVENOX) injection  40 mg Subcutaneous Q24H   free water  250 mL Oral Once   polyethylene glycol  17 g Oral Daily   sodium  chloride flush  3 mL Intravenous Q12H    Allergies: No Known Allergies  Family History  Problem Relation Age of Onset   Hypertension Other      Physical Exam: Vitals:   02/19/24 2100 02/20/24 0041 02/20/24 0519 02/20/24 0943  BP: 136/63 103/63 (!) 118/59 129/65  Pulse: (!) 41 (!) 37  60  Resp: 18 18 18 17   Temp: 97.6 F (36.4 C) 97.7 F (36.5 C) 98.4 F (36.9 C) 97.6 F (36.4 C)  TempSrc: Axillary Oral Oral Oral  SpO2: 100% 97% 96%   Weight:      Height:        GEN- NAD, A&O x 3, normal affect HEENT: Normocephalic, atraumatic Lungs- CTAB, Normal effort.  Heart- Regular, bradycardic rate and rhythm, No M/G/R.  GI- Soft, NT, ND.  Extremities- No clubbing, cyanosis, or edema. R radial cath site with ecchymosis, 2+ pulse   Radiology/Studies: ECHOCARDIOGRAM COMPLETE Result Date: 02/20/2024    ECHOCARDIOGRAM REPORT   Patient Name:   Ian Ritter Date of Exam: 02/20/2024 Medical Rec #:  [id number]       Height:       71.0 in Accession #:    [id number]      Weight:       210.9 lb Date of Birth:  1941-02-14       BSA:          2.157 m Patient Age:    83 years        BP:           118/59 mmHg Patient Gender: M               HR:           45 bpm. Exam Location:  Inpatient Procedure: 2D Echo, Cardiac Doppler and Color Doppler (Both Spectral and Color            Flow Doppler were utilized during procedure). Indications:    CAD - Native Vessel  History:        Patient has no prior history of Echocardiogram examinations. CAD                 and Angina, Arrythmias:RBBB; Risk Factors:Hypertension and                 Dyslipidemia.  Sonographer:    Juliene Rucks Referring Phys: LONNI END IMPRESSIONS  1. Indeteriminate diastology- appears to have 2:1 heart block on spectral Doppler- clinical correlation and telemetry assessment advised. Left ventricular ejection fraction, by estimation, is 60 to 65%. The left ventricle has normal function. The left ventricle has no regional wall motion  abnormalities. There is mild left ventricular hypertrophy. Left ventricular diastolic parameters are indeterminate.  2. Right ventricular systolic function is normal. The right ventricular  size is mildly enlarged.  3. The mitral valve is normal in structure. No evidence of mitral valve regurgitation. No evidence of mitral stenosis.  4. The aortic valve is tricuspid. Aortic valve regurgitation is trivial. No aortic stenosis is present.  5. The inferior vena cava is normal in size with greater than 50% respiratory variability, suggesting right atrial pressure of 3 mmHg. Comparison(s): No prior Echocardiogram. FINDINGS  Left Ventricle: Indeteriminate diastology- appears to have 2:1 heart block on spectral Doppler- clinical correlation and telemetry assessment advised. Left ventricular ejection fraction, by estimation, is 60 to 65%. The left ventricle has normal function. The left ventricle has no regional wall motion abnormalities. The left ventricular internal cavity size was normal in size. There is mild left ventricular hypertrophy. Left ventricular diastolic parameters are indeterminate. Right Ventricle: The right ventricular size is mildly enlarged. No increase in right ventricular wall thickness. Right ventricular systolic function is normal. Left Atrium: Left atrial size was normal in size. Right Atrium: Right atrial size was normal in size. Pericardium: There is no evidence of pericardial effusion. Presence of epicardial fat layer. Mitral Valve: The mitral valve is normal in structure. No evidence of mitral valve regurgitation. No evidence of mitral valve stenosis. Tricuspid Valve: The tricuspid valve is normal in structure. Tricuspid valve regurgitation is not demonstrated. No evidence of tricuspid stenosis. Aortic Valve: The aortic valve is tricuspid. Aortic valve regurgitation is trivial. No aortic stenosis is present. Pulmonic Valve: The pulmonic valve was not well visualized. Pulmonic valve regurgitation  is not visualized. No evidence of pulmonic stenosis. Aorta: The aortic root and ascending aorta are structurally normal, with no evidence of dilitation. Venous: The inferior vena cava is normal in size with greater than 50% respiratory variability, suggesting right atrial pressure of 3 mmHg. IAS/Shunts: The atrial septum is grossly normal.  LEFT VENTRICLE PLAX 2D LVIDd:         4.50 cm   Diastology LVIDs:         3.00 cm   LV e' medial:    14.00 cm/s LV PW:         1.20 cm   LV E/e' medial:  5.9 LV IVS:        1.10 cm   LV e' lateral:   16.50 cm/s LVOT diam:     2.00 cm   LV E/e' lateral: 5.0 LV SV:         104 LV SV Index:   48 LVOT Area:     3.14 cm  RIGHT VENTRICLE             IVC RV Basal diam:  4.00 cm     IVC diam: 1.20 cm RV Mid diam:    3.80 cm RV S prime:     17.70 cm/s TAPSE (M-mode): 3.0 cm LEFT ATRIUM             Index        RIGHT ATRIUM           Index LA diam:        4.30 cm 1.99 cm/m   RA Area:     15.20 cm LA Vol (A2C):   30.9 ml 14.33 ml/m  RA Volume:   34.00 ml  15.77 ml/m LA Vol (A4C):   59.3 ml 27.50 ml/m LA Biplane Vol: 44.8 ml 20.77 ml/m  AORTIC VALVE LVOT Vmax:   167.00 cm/s LVOT Vmean:  100.000 cm/s LVOT VTI:    0.332 m  AORTA Ao Root diam:  3.00 cm Ao Asc diam:  3.00 cm MITRAL VALVE MV Area (PHT): 2.83 cm    SHUNTS MV Decel Time: 268 msec    Systemic VTI:  0.33 m MV E velocity: 83.20 cm/s  Systemic Diam: 2.00 cm MV A velocity: 97.20 cm/s MV E/A ratio:  0.86 Ian Leavens MD Electronically signed by Ian Leavens MD Signature Date/Time: 02/20/2024/11:01:38 AM    Final    CARDIAC CATHETERIZATION Result Date: 02/19/2024 Conclusions: Severe single-vessel coronary artery disease with sequential 80-90% and heavily calcified 50% proximal/mid LAD stenoses.  LCx and RCA are without significant disease. Mildly elevated left ventricular filling pressure (LVEDP 20 mmHg). 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  This persisted throughout the case with  an underlying junctional escape rhythm (patient has RBBB at baseline). Successful IVUS-guided PCI to the proximal/mid LAD using a Synergy XD 3.0 x 28 mm drug-eluting stent with 0% residual stenosis and TIMI-3 flow. Recommendations: Overnight observation in the setting of continued complete heart block following PCI to the LAD.  If heart block does not improve with washout of metoprolol , EP consultation Udell Blasingame need to be considered prior to discharge. Dual antiplatelet therapy with aspirin and clopidogrel for at least 6 months. Continue aggressive secondary prevention of coronary artery disease. Obtain echocardiogram. Lonni Hanson, MD [facility]  CT CORONARY [facility] W/CTA COR W/SCORE DEL W/CM &/OR WO/CM Addendum Date: 02/17/2024 ADDENDUM REPORT: 02/17/2024 15:02 EXAM: OVER-READ INTERPRETATION  CT CHEST The following report is an over-read performed by radiologist Dr. Ree Levy [facility] on 02/17/2024. This over-read does not include interpretation of cardiac or coronary anatomy or pathology. The coronary CTA interpretation by the cardiologist is attached. COMPARISON:  CT scan abdomen and pelvis from 11/26/2011. FINDINGS: Mediastinum/Nodes: No solid / cystic mediastinal masses. The visualized esophagus is nondistended precluding optimal assessment. No thoracic lymphadenopathy by size criteria. Lungs/Pleura: Imaged tracheo-bronchial tree is patent. There are dependent changes in bilateral lungs. No mass or consolidation. No pleural effusion or pneumothorax. There is a 5 mm sized noncalcified nodule in the right lower lobe, abutting the right major fissure (series 304, image 103), which is unchanged since the prior study from 2013 and favored benign. However, there are 2 adjacent noncalcified nodules in the right lung lower lobe, inferolaterally measuring up to 3 x 6 and 4 x 5 mm (series 304, images 179 and 184), which are new since the prior study from 2013. Please see follow-up  recommendations below. Upper Abdomen: The subcapsular sub 5 mm hypoattenuating focus in the left hepatic dome (series 303, image 218), which is too small to adequately characterize. Remaining visualized upper abdominal viscera within normal limits. Musculoskeletal: The visualized soft tissues of the chest wall are grossly unremarkable. There is mild loss of height of lower thoracic vertebrae (exit number is difficult to determine due to limited field of view). There is sclerosis along the superior endplate of vertebral body and sclerosis of the adjacent vertebral endplate. There is age indeterminate fracture of the anterior bridging osteophyte. There is small defect along the anterior superior corner of the vertebral body. Findings are concerning for acute versus subacute vertebral fracture. Correlate clinically to determine the need for additional imaging with dedicated thoracic spine MRI or CT scan. IMPRESSION: 1. There are 2 new solid noncalcified nodules in the right lung lower lobe, inferolaterally measuring up to 6 mm. Please see follow-up recommendations below. 2. Mild superior endplate compression deformity of lower thoracic vertebrae and age indeterminate fracture of the anterior bridging osteophyte/anterosuperior  corner of the vertebral body, as discussed in detail above. Correlate clinically to determine the need for additional imaging with dedicated thoracic spine MRI or CT scan. 3. Multiple other nonacute observations, as described above. Pulmonary nodule follow-up recommendation: Solid lung nodules measuring up to 6 mm: - LOW RISK: No routine follow-up. - HIGHER RISK: Optional CT at 12 months. Recommendations for evaluation of incidental nodules derived from guidelines developed by the Fleischner Society ( Radiology 2017; [phone]). These guidelines apply to incidental nodules, which can be managed according to the specific recommendations. These guidelines do not apply to patient's younger than 35  years, immunocompromised patients, or patients with cancer. For lung cancer screening, adherence to the existing [employer] of radiology lung CT Screening Reporting and Data System (lung-RADS) guidelines is recommended. High risk factors include older age, heavy smoking, larger nodule size, irregular or spiculated margins and upper lobe location. Clinical risk factors include smoking, exposure to other carcinogens, emphysema, fibrosis, upper lobe location, family history of lung cancer, age and sex. Electronically Signed   By: Ree Molt M.D.   On: 02/17/2024 15:02   Result Date: 02/17/2024 CLINICAL DATA:  This a 83 year old male with anginal symptoms EXAM: Cardiac/Coronary  CTA TECHNIQUE: The patient was scanned on a [employer]. FINDINGS: A 100 kV prospective scan was triggered in the descending thoracic aorta at 111 HU's. Axial non-contrast 3 mm slices were carried out through the heart. The data set was analyzed on a dedicated work station and scored using the Agatson method. Gantry rotation speed was 250 msecs and collimation was .6 mm. No beta blockade and 0.8 mg of sl NTG was given. The 3D data set was reconstructed in 5% intervals of the 67-82 % of the R-R cycle. Diastolic phases were analyzed on a dedicated work station using MPR, MIP and VRT modes. The patient received 80 cc of contrast. Aorta: Normal size.  No calcifications.  No dissection. Aortic Valve:  Trileaflet.  No calcifications. Coronary Arteries:  Normal coronary origin.  Right Dominance. RCA is a large dominant artery that gives rise to PDA and PLA. There is mild (25-49%) calcified plaque in the proximal RCA. The mid and distal RCA with no plaques. Moderate (50-69%) soft plaque in the proximal portion of the PDA. Left main is a large artery that gives rise to LAD and LCX arteries. Distal left main with focal mild calcified plaque. LAD is a large vessel. The proximal LAD with a mild focal calcification. Immediately below  the mild focal calcified plaque lies a moderate soft plaques that extends in to a severe calcified plaque with noted blooming artifact. At the proximal to mid LAD is a focal mild calcified plaque. D1, D2 and D3 all medium sized vessels with no plaques. LCX is a non-dominant artery that gives rise to one large OM1 branch. There is no plaque. First obtuse marginal with no plaques. Coronary Calcium Score: Left main: 22 Left anterior descending artery: 259 Left circumflex artery: 0 Right coronary artery: 94.4 Total: 376 Percentile: 42 Other findings: Normal pulmonary vein drainage into the left atrium. Normal left atrial appendage without a thrombus. Normal size of the pulmonary artery. IMPRESSION: 1. Coronary calcium score of 376. This was 37 percentile for age and sex matched control. 2. Normal coronary origin with right dominance. 3. CAD-RADS 4 Severe stenosis. (70-99% or > 50% left main). Cardiac catheterization is recommended. CT FFR Ege Muckey also be ordered. In addition, consider symptom-guided anti-ischemic pharmacotherapy as well as risk factor  modification per guideline directed care. The noncardiac portion of this study Lakoda Raske be interpreted in separate report by the radiologist. Electronically Signed: By: Kardie  Tobb D.O. On: 02/12/2024 17:12   CT CORONARY FRACTIONAL FLOW RESERVE FLUID ANALYSIS Result Date: 02/13/2024 EXAM: CT FFR ANALYSIS CLINICAL DATA:  cad FINDINGS: FFRct analysis was performed on the original cardiac CT angiogram dataset. Diagrammatic representation of the FFRct analysis is provided in a separate PDF document in PACS. This dictation was created using the PDF document and an interactive 3D model of the results. 3D model is not available in the EMR/PACS. Normal FFR range is >0.80. Indeterminate (grey) zone is 0.76-0.80. 1. Left Main: FFR = 1.00 2. LAD: Proximal FFR = 1.00, mid FFR = 0.57 3. LCX: Proximal FFR = 0.99, distal FFR = 0.97 4. RCA: Proximal FFR = 0.96, mid FFR =0.95, distal FFR =  0.95 IMPRESSION: 1. CT FFR analysis showed significant stenosis in the proximal LAD. RECOMMENDATIONS: Cardiac Catheterization. Electronically Signed   By: Kardie  Tobb D.O.   On: 02/13/2024 14:59    EKG:  01/31/2024 - SR at 61 w RBBB 02/19/2024 - CHB at 39 w RBBB 02/20/2024 at 545 - Chb at 39 with RBBB (personally reviewed)  TELEMETRY: CHB, rates 40 (personally reviewed)  DEVICE HISTORY: none  Assessment/Plan: #) CHB #) CAD s/p PCI on 11/3 #) RBBB  Patient presented to [facility] 11/3 for outpatient LHC and s/p PCI to LAD. During procedure when crossing aortic valve, patient developed CHB that has persisted He overall feels well with some mild DOE.  QRS is similar to prior RBBB morphology He was on 50mg  lopressor  PTA, last dose 11/3 in AM Shraga Custard continue to hold BB and monitor for conduction system recovery           For questions or updates, please contact [facility] Please consult [email or web address] for contact info under Cardiology/STEMI.  Signed, Suzann Riddle, NP  02/20/2024 12:38 PM    Ryan DELENA Helm was seen by me today along with Suzann Riddle. I have personally performed an evaluation on this patient.  My findings are as follows: 83 y.o. male patient with a past history as above.  He was admitted to the hospital after outpatient left heart catheterization.  He had PCI to the proximal LAD.  During the procedure, he developed complete heart block.  Heart block occurred when crossing the aortic valve.  He does have a baseline right bundle branch block.  Overnight, he has continued to have complete heart block.  He has no chest pain and no shortness of breath.  He is unaware of his heart block.  Data: EKG(s) and pertinent labs, studies, etc were personally reviewed and interpreted by me:  Telemetry, EKG Otherwise, I agree with data as outlined by the advanced practice provider.  Exam performed by me: Gen: No acute distress Neck: No JVD Cardiac: Bradycardic Lungs:  Normal work of breathing Extremities: No edema  My Assessment and Plan:  1.  Coronary artery disease 2.  Complete heart block 3.  Right bundle branch block  Patient had left heart catheterization complicated by complete heart block.  He had PCI to his LAD.  Complete heart block occurred when crossing the aortic valve.  He does have pre-existing right bundle branch block.  It is most likely that he had trauma to his conduction system when crossing the valve.  For now, would hold his metoprolol .  Danylah Holden hold off on pacemaker implant for now.  This is certainly complicated by  the fact that he would need dual antiplatelet therapy for recent stent placement.  Signed,  Yeraldy Spike Gladis Norton, MD  02/20/2024 1:37 PM

## 2024-02-20 NOTE — Plan of Care (Signed)
   Problem: Education: Goal: Understanding of CV disease, CV risk reduction, and recovery process will improve Outcome: Progressing   Problem: Activity: Goal: Ability to return to baseline activity level will improve Outcome: Progressing   Problem: Cardiovascular: Goal: Ability to achieve and maintain adequate cardiovascular perfusion will improve Outcome: Progressing Goal: Vascular access site(s) Level 0-1 will be maintained Outcome: Progressing   Problem: Health Behavior/Discharge Planning: Goal: Ability to safely manage health-related needs after discharge will improve Outcome: Progressing

## 2024-02-20 NOTE — Progress Notes (Signed)
 Echocardiogram 2D Echocardiogram has been performed.  Ian Ritter 02/20/2024, 9:36 AM

## 2024-02-20 NOTE — Progress Notes (Signed)
 Pt was educated on stent card, stent location, Antiplatelet and ASA use, wt restrictions, no baths/daily wash-ups, s/s of infection, ex guidelines, s/s to stop exercising, NTG use and calling 911, heart healthy diet, risk factors and CRPII. Pt received materials on exercise, diet, and CRPII. Will refer to [facility].   Garen FORBES Candy MS, ACSM-CEP 02/20/2024 10:29 AM

## 2024-02-21 ENCOUNTER — Telehealth: Payer: Self-pay | Admitting: Physician Assistant

## 2024-02-21 ENCOUNTER — Other Ambulatory Visit (HOSPITAL_COMMUNITY): Payer: Self-pay

## 2024-02-21 ENCOUNTER — Inpatient Hospital Stay (HOSPITAL_BASED_OUTPATIENT_CLINIC_OR_DEPARTMENT_OTHER)
Admission: RE | Admit: 2024-02-21 | Discharge: 2024-02-21 | Disposition: A | Discharge status: Home / Self Care | Source: Home / Self Care | Attending: Cardiology | Admitting: Cardiology

## 2024-02-21 ENCOUNTER — Other Ambulatory Visit: Payer: Self-pay | Admitting: Cardiology

## 2024-02-21 ENCOUNTER — Telehealth: Payer: Self-pay | Admitting: Cardiology

## 2024-02-21 DIAGNOSIS — N1831 Chronic kidney disease, stage 3a: Secondary | ICD-10-CM | POA: Insufficient documentation

## 2024-02-21 DIAGNOSIS — I442 Atrioventricular block, complete: Secondary | ICD-10-CM

## 2024-02-21 DIAGNOSIS — E785 Hyperlipidemia, unspecified: Secondary | ICD-10-CM | POA: Insufficient documentation

## 2024-02-21 DIAGNOSIS — I25119 Atherosclerotic heart disease of native coronary artery with unspecified angina pectoris: Secondary | ICD-10-CM | POA: Diagnosis not present

## 2024-02-21 DIAGNOSIS — I10 Essential (primary) hypertension: Secondary | ICD-10-CM | POA: Insufficient documentation

## 2024-02-21 LAB — LIPOPROTEIN A (LPA): Lipoprotein (a): 17.1 nmol/L (ref <75.0)

## 2024-02-21 MED ORDER — CLOPIDOGREL BISULFATE 75 MG PO TABS
75.0000 mg | ORAL_TABLET | Freq: Every day | ORAL | 2 refills | Status: AC
  Filled 2024-02-21: qty 90, 90d supply, fill #0

## 2024-02-21 MED ORDER — AMLODIPINE BESYLATE 5 MG PO TABS
5.0000 mg | ORAL_TABLET | Freq: Every day | ORAL | 3 refills | Status: AC
  Filled 2024-02-21: qty 30, 30d supply, fill #0

## 2024-02-21 NOTE — Progress Notes (Signed)
 Discharge Nurse Summary: DC order noted per MD. DC RN at bedside with patient. Patient agreeable with discharge plan, family at the bedside. AVS printed/reviewed. PIV removed, skin intact. No DME needs. No home meds. TOC meds delivered to the patient. CP/Edu resolved. Telemonitor returned to charging station. All belongings accounted for. Ziopatch in place. Patient wheeled downstairs for discharge by private auto.   Rosario EMERSON Lund, RN

## 2024-02-21 NOTE — Telephone Encounter (Signed)
 Also received iRhythm event monitor alert to report another episode of CHB at 6:50pm, 6:52pm, and 6:55pm with ventricular rhythm at 44-46bpm. Appears this issue is already known as patient was discharged from hospital in CHB with plan to see EP to f/u Zio monitor results. EP team in the works of adjusting alarm/call parameters to HR <40 during daytime or <35 during nighttime. Patient not on any AVN blocking agents. Am leaving this note for documentation purposes, no acute intervention needed.  Curtistine LITTIE Farr, MD Cardiology 02/21/2024

## 2024-02-21 NOTE — Progress Notes (Addendum)
  Patient Name: Ian Ritter Date of Encounter: 02/21/2024  Primary Cardiologist: Ian ONEIDA Decent, MD Electrophysiologist: None  Interval Summary   NAEON. Patient walked halls yesterday without SOB.  No chest pain, chest pressure, palpitations.  No complaints regarding R wrist LHC access site.    Vital Signs    Vitals:   02/20/24 1846 02/20/24 2056 02/21/24 0500 02/21/24 0741  BP: 126/62 (!) 110/56 122/61 (!) 150/77  Pulse: (!) 43 (!) 45 (!) 41 (!) 43  Resp: 17 17 18 17   Temp: 97.9 F (36.6 C) 97.9 F (36.6 C) 98.5 F (36.9 C) 98.3 F (36.8 C)  TempSrc: Oral Oral Oral Oral  SpO2:  98% 95% 97%  Weight:      Height:       No intake or output data in the 24 hours ending 02/21/24 0914 Filed Weights   02/19/24 0910 02/19/24 1523  Weight: 95.3 kg 95.7 kg    Physical Exam    GEN- NAD, Alert and oriented  Lungs- Clear to ausculation bilaterally, normal work of breathing Cardiac- Regular, bradycardic rate and rhythm, no murmurs, rubs or gallops GI- soft, NT, ND, + BS Extremities- no clubbing or cyanosis. No edema. R radial 2+ pulse, warm, minimal ecchymosis  Telemetry    CHB 40s (personally reviewed)  Hospital Course    Ian Ritter is a 83 y.o. male with PMH of CAD s/p PCI (11/3), HTN, HLD, transient thyrotoxicosis, CKD-3a, diverticulitis s/p resection & anastomosis (2007), IBS admitted for CHB.  11/3 - outpatient LHC c/b CHB  Assessment & Plan    #) CHB #) CAD s/p LAD PCI #) RBBB Patient remains in CHB with stable QRS morphology with rates in 40s Walked halls yesterday without complaints He remains on plavix/ASA for LAD PCI  Given need for ongoing anti-platelet with narrow QRS without activity restrictions, favor watchful waiting for conduction system recovery.  MD to see for final decision.      For questions or updates, please contact [facility] Please consult [email or web address] for contact info under     Signed, Suzann Riddle, NP   02/21/2024, 9:14 AM     Ian Ritter was seen by me today along with Ian Ritter. I have personally performed an evaluation on this patient.  My findings are as follows: 83 y.o. male feeling well today.  Has been walking the halls without issue..  Data: EKG(s) and pertinent labs, studies, etc were personally reviewed and interpreted by me:  Telemetry Otherwise, I agree with data as outlined by the advanced practice provider.  Exam performed by me: Gen: No acute distress Neck: No JVD Cardiac: Bradycardic Lungs: Clear Extremities: No edema  My Assessment and Plan: 1.  Complete heart block 2.  Coronary artery disease post LAD PCI 3.  Right bundle branch block  Patient remains with complete heart block.  Has a stable escape in the 40s.  QRS morphology is the same as his QRS when he conducts.  For now, would plan on discharge.  Conduction would likely improve.  Would plan for 2-week live monitor at discharge.  Signed,  Ian Vessey Gladis Norton, MD  02/21/2024 1:32 PM

## 2024-02-21 NOTE — Progress Notes (Addendum)
 Progress Note  Patient Name: Ian Ritter Date of Encounter: 02/21/2024 [facility] Cardiologist: Darryle ONEIDA Decent, MD   Interval Summary    No complaints this morning, walked the hallway without issue  Vital Signs Vitals:   02/20/24 1846 02/20/24 2056 02/21/24 0500 02/21/24 0741  BP: 126/62 (!) 110/56 122/61 (!) 150/77  Pulse: (!) 43 (!) 45 (!) 41 (!) 43  Resp: 17 17 18 17   Temp: 97.9 F (36.6 C) 97.9 F (36.6 C) 98.5 F (36.9 C) 98.3 F (36.8 C)  TempSrc: Oral Oral Oral Oral  SpO2:  98% 95% 97%  Weight:      Height:       No intake or output data in the 24 hours ending 02/21/24 1019    02/19/2024    3:23 PM 02/19/2024    9:10 AM 01/31/2024    2:52 PM  Last 3 Weights  Weight (lbs) 210 lb 14.4 oz 210 lb 220 lb  Weight (kg) 95.664 kg 95.255 kg 99.791 kg      Telemetry/ECG   Complete heart block, rates 40-50s - Personally Reviewed  Physical Exam  GEN: No acute distress.   Neck: No JVD Cardiac: bradycardia, no murmurs, rubs, or gallops.  Respiratory: Clear to auscultation bilaterally. GI: Soft, nontender, non-distended  MS: No edema  Assessment & Plan   83 y.o. male with a history of hypertension, hyperlipidemia, transient thyrotoxicosis, CKD stage IIIa, and diverticulitis s/p resection and anastomosis in 2007, and IBS who presented for outpatient cardiac cath.  Presented for a heart first day and seen by Aline Door, PA.  Underwent outpatient coronary CTA with significant stenosis in the proximal LAD.  Set up for outpatient cardiac catheterization.   CAD -- Underwent outpatient cardiac catheterization 11/3 with single-vessel CAD with 80 to 90% and heavily calcified 50% proximal/mid LAD stenosis treated with IVUS guided PCI/DES x 1.  Commendations for DAPT with aspirin/Plavix for at least 6 months -- No recurrent chest pain -- Echo 11/5 LVEF of 60 to 65%, mild LVH, normal RV, no significant valvular disease   Complete heart block -- Developed to  complete heart block when crossing aortic valve followed by junctional escape rhythm with known right bundle branch block at baseline. -- Continues to have complete heart block with heart rates in the 40s, narrow complex, asymptomatic -- On metoprolol  50 mg twice daily PTA, last dose was morning of 11/3 -- Ep following, will follow up on final recs   HLD -- Lipid panel in 06/2023 at PCP's office: Total Cholesterol 170, Triglycerides 142, HDL 34, LDL 111  -- started on atorvastatin 80mg  daily this admission -- will need FLP/LFTs in 8 weeks   CKD IIIa -- Cr 1.37>>1.58 -- hold lisinopril for now    For questions or updates, please contact [facility] Please consult [email or web address] for contact info under   Signed, Manuelita Rummer, NP   Patient seen, examined. Available data reviewed. Agree with findings, assessment, and plan as outlined by Manuelita Rummer, NP.  On exam, the patient is alert and oriented with no distress.  HEENT is normal, JVP is normal, lungs are clear bilaterally, heart is bradycardic and regular with no murmur gallop, abdomen soft and nontender, extremities have no edema, right radial cath site is clear with mild ecchymoses but no hematoma.  Telemetry is reviewed and shows complete AV block and periods of 2: 1 AV block.  Escape rhythm remained stable and the patient is asymptomatic.  Appreciate EP evaluation.  Plans  for live outpatient telemetry monitoring for 2 weeks.  Anticipate conduction system has a good chance to fully recover.  Patient understands ER precautions and potential symptoms of AV block including lightheadedness and syncope.  Otherwise, as outlined above.  Ozell Fell, M.D. 02/21/2024 11:12 AM

## 2024-02-21 NOTE — Progress Notes (Signed)
[employer] AT applied at hospital.  Dr. Eleonore Chiquito to read.

## 2024-02-21 NOTE — Telephone Encounter (Signed)
   [employer] called the answering service after-hours today to report critical EKG alert of complete heart block with HR between 42-43bpm between 3:34and 4:01pm. I spoke with Dr.Cooper who affirms this was expected and no acute intervention needed - patient left hospital in CHB with plan for EP to follow [employer] monitor. I spoke with patient who reports he feels fine. ER/return precautions reviewed.  As I anticipate there may be several office and after hours phone calls across the duration of his monitor for CHB, will route to EP team update [employer] company in AM with any parameters they would like to be notified for.  Sanyia Dini N Hilma Steinhilber, PA-C

## 2024-02-21 NOTE — Discharge Summary (Addendum)
 Discharge Summary   Patient ID: Ian Ritter MRN: [id number]; DOB: [birth year]  Admit date: 02/19/2024 Discharge date: 02/21/2024  PCP:  Gerome Brunet, DO   [facility] Providers Cardiologist:  Darryle ONEIDA Decent, MD  Cardiology APP:  Goodrich, Callie E, PA-C    Discharge Diagnoses  Principal Problem:   Coronary artery disease with angina pectoris Active Problems:   Heart block AV complete (HCC)   Hyperlipidemia   Hypertension   CKD stage 3a, GFR 45-59 ml/min [facility])   Diagnostic Studies/Procedures   Cath: 02/19/2024  Conclusions: Severe single-vessel coronary artery disease with sequential 80-90% and heavily calcified 50% proximal/mid LAD stenoses.  LCx and RCA are without significant disease. Mildly elevated left ventricular filling pressure (LVEDP 20 mmHg). 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  This persisted throughout the case with an underlying junctional escape rhythm (patient has RBBB at baseline). Successful IVUS-guided PCI to the proximal/mid LAD using a Synergy XD 3.0 x 28 mm drug-eluting stent with 0% residual stenosis and TIMI-3 flow.   Recommendations: Overnight observation in the setting of continued complete heart block following PCI to the LAD.  If heart block does not improve with washout of metoprolol , EP consultation will need to be considered prior to discharge. Dual antiplatelet therapy with aspirin and clopidogrel for at least 6 months. Continue aggressive secondary prevention of coronary artery disease. Obtain echocardiogram.   Lonni Hanson, MD [facility]  Diagnostic Dominance: Right  Intervention   _____________   History of Present Illness   Ian Ritter is a 83 y.o. male with a history of hypertension, hyperlipidemia, transient thyrotoxicosis, CKD stage IIIa, and diverticulitis s/p resection and anastomosis in 2007, and IBS who presented to the clinic as a new patient in the [facility] for further evaluation of chest pain by Aline Door.   Patient was seen by PCP on 01/30/2024 and reported chest tightness with exertion.  Therefore, he was referred to Cardiology for further evaluation.   He reported exertional chest pain for the past 3-4 months that typically occurs after he has been walking briskly or when walking up an incline for about 5 -10 minutes.  He describes this as a burning sensation that will radiate to his arms and his neck if he does not stop.  Symptoms will resolve quickly once he stops and rests.  He described getting a little winded with these activities as well.  He denied any significant shortness of breath.  Otherwise, no cardiac complaints.  No orthopnea, PND, edema, palpitations, lightheadedness/dizziness, syncope.   He does have a family history of cardiovascular disease.  His father had hypertension and stroke.  His mother had CHF in her 17s.  He denies any history of tobacco use.   Recent labs from PCP's office on 01/23/2024: - CBC: WBC 6.4, Hgb 13.7, Plts 187 - CMET: Na 140, K 4.0, Glucose 97, BUN 19, Cr 1.44 (eGFR 48), Albumin 4.3, AST 22, ALT 18, Alk Phos 105, Total Bili 0.6 - TSH 0.674 (normal) - PSA elevated at 6.8   - Lipid panel from 06/2023: Total Cholesterol 170, Triglycerides 142, HLD 34, LDL 111 - Hemoglobin A1c from 06/2023: 5.5%  He was ordered for CCTa which showed concerning stenosis of the pLAD, therefore was set up for outpatient cardiac cath.   Hospital Course   Consultants: EP   CAD -- Underwent outpatient cardiac catheterization 11/3 with single-vessel CAD with 80 to 90% and heavily calcified 50% proximal/mid LAD stenosis treated  with IVUS guided PCI/DES x 1.  Recommendations for DAPT with aspirin/Plavix for at least 6 months -- No recurrent chest pain -- Echo 11/5 LVEF of 60 to 65%, mild LVH, normal RV, no significant valvular disease   Complete heart block -- Developed to complete heart block when crossing aortic  valve followed by junctional escape rhythm with known right bundle branch block at baseline. -- On metoprolol  50 mg twice daily PTA, last dose was morning of 11/3, continued to have CHB with intermittent 2:1 AVB. Asymptomatic  -- seen by EP, plans for live 14d Zio at discharge -- patient instructed regarding no driving until cleared to do so   HLD -- Lipid panel in 06/2023 at PCP's office: Total Cholesterol 170, Triglycerides 142, HDL 34, LDL 111  -- started on atorvastatin 80mg  daily this admission -- will need FLP/LFTs in 8 weeks   CKD IIIa -- Cr 1.37>>1.58 -- hold lisinopril for now. Could potential resume outpatient pending Cr   HTN -- as noted above, lisinopril held. Metoprolol  stopped -- added norvasc 5mg  daily at discharge       Did the patient have an acute coronary syndrome (MI, NSTEMI, STEMI, etc) this admission?:  No                               Did the patient have a percutaneous coronary intervention (stent / angioplasty)?:  Yes.     Cath/PCI Registry Performance & Quality Measures: Aspirin prescribed? - Yes ADP Receptor Inhibitor (Plavix/Clopidogrel, Brilinta/Ticagrelor or Effient/Prasugrel) prescribed (includes medically managed patients)? - Yes High Intensity Statin (Lipitor 40-80mg  or Crestor 20-40mg ) prescribed? - Yes For EF <40%, was ACEI/ARB prescribed? - Not Applicable (EF >/= 40%) For EF <40%, Aldosterone Antagonist (Spironolactone or Eplerenone) prescribed? - Not Applicable (EF >/= 40%) Cardiac Rehab Phase II ordered? - Yes         _____________  Discharge Vitals Blood pressure 117/62, pulse (!) 45, temperature 98.1 F (36.7 C), temperature source Oral, resp. rate 17, height 5' 11 (1.803 m), weight 95.7 kg, SpO2 98%.  Filed Weights   02/19/24 0910 02/19/24 1523  Weight: 95.3 kg 95.7 kg    Labs & Radiologic Studies  CBC Recent Labs    02/19/24 0735 02/20/24 0406  WBC 7.9 6.8  HGB 14.4 12.5*  HCT 44.1 37.3*  MCV 101.4* 98.9  PLT 235 187    Basic Metabolic Panel Recent Labs    88/96/74 0735 02/20/24 0406  NA 138 141  K 3.5 3.7  CL 100 106  CO2 26 24  GLUCOSE 109* 95  BUN 15 15  CREATININE 1.37* 1.58*  CALCIUM 9.8 9.1   Liver Function Tests No results for input(s): AST, ALT, ALKPHOS, BILITOT, PROT, ALBUMIN in the last 72 hours. No results for input(s): LIPASE, AMYLASE in the last 72 hours. High Sensitivity Troponin:   No results for input(s): TROPONINIHS in the last 720 hours.  No results for input(s): TRNPT in the last 720 hours.  BNP Invalid input(s): POCBNP No results for input(s): PROBNP in the last 72 hours.  No results for input(s): BNP in the last 72 hours.  D-Dimer No results for input(s): DDIMER in the last 72 hours. Hemoglobin A1C No results for input(s): HGBA1C in the last 72 hours. Fasting Lipid Panel No results for input(s): CHOL, HDL, LDLCALC, TRIG, CHOLHDL, LDLDIRECT in the last 72 hours. Lipoprotein (a)  Date/Time Value Ref Range Status  02/20/2024 04:06 AM 17.1 <  75.0 nmol/L Final    Comment:    (NOTE) This test was developed and its performance characteristics determined by [facility]. It has not been cleared or approved by the Food and Drug Administration. Note:  Values greater than or equal to 75.0 nmol/L may       indicate an independent risk factor for CHD,       but must be evaluated with caution when applied       to non-Caucasian populations due to the       influence of genetic factors on Lp(a) across       ethnicities. Performed At: [facility] [address] [place], [place] [id number] Jennette Shorter MD Ey:[phone]     Thyroid  Function Tests No results for input(s): TSH, T4TOTAL, T3FREE, THYROIDAB in the last 72 hours.  Invalid input(s): FREET3 _____________  ECHOCARDIOGRAM COMPLETE Result Date: 02/20/2024    ECHOCARDIOGRAM REPORT   Patient Name:   Ian Ritter Date of Exam: 02/20/2024 Medical Rec #:   [id number]       Height:       71.0 in Accession #:    [id number]      Weight:       210.9 lb Date of Birth:  1940/10/08       BSA:          2.157 m Patient Age:    83 years        BP:           118/59 mmHg Patient Gender: M               HR:           45 bpm. Exam Location:  Inpatient Procedure: 2D Echo, Cardiac Doppler and Color Doppler (Both Spectral and Color            Flow Doppler were utilized during procedure). Indications:    CAD - Native Vessel  History:        Patient has no prior history of Echocardiogram examinations. CAD                 and Angina, Arrythmias:RBBB; Risk Factors:Hypertension and                 Dyslipidemia.  Sonographer:    Juliene Rucks Referring Phys: LONNI END IMPRESSIONS  1. Indeteriminate diastology- appears to have 2:1 heart block on spectral Doppler- clinical correlation and telemetry assessment advised. Left ventricular ejection fraction, by estimation, is 60 to 65%. The left ventricle has normal function. The left ventricle has no regional wall motion abnormalities. There is mild left ventricular hypertrophy. Left ventricular diastolic parameters are indeterminate.  2. Right ventricular systolic function is normal. The right ventricular size is mildly enlarged.  3. The mitral valve is normal in structure. No evidence of mitral valve regurgitation. No evidence of mitral stenosis.  4. The aortic valve is tricuspid. Aortic valve regurgitation is trivial. No aortic stenosis is present.  5. The inferior vena cava is normal in size with greater than 50% respiratory variability, suggesting right atrial pressure of 3 mmHg. Comparison(s): No prior Echocardiogram. FINDINGS  Left Ventricle: Indeteriminate diastology- appears to have 2:1 heart block on spectral Doppler- clinical correlation and telemetry assessment advised. Left ventricular ejection fraction, by estimation, is 60 to 65%. The left ventricle has normal function. The left ventricle has no regional wall motion  abnormalities. The left ventricular internal cavity size was normal in size. There is mild left ventricular hypertrophy. Left ventricular  diastolic parameters are indeterminate. Right Ventricle: The right ventricular size is mildly enlarged. No increase in right ventricular wall thickness. Right ventricular systolic function is normal. Left Atrium: Left atrial size was normal in size. Right Atrium: Right atrial size was normal in size. Pericardium: There is no evidence of pericardial effusion. Presence of epicardial fat layer. Mitral Valve: The mitral valve is normal in structure. No evidence of mitral valve regurgitation. No evidence of mitral valve stenosis. Tricuspid Valve: The tricuspid valve is normal in structure. Tricuspid valve regurgitation is not demonstrated. No evidence of tricuspid stenosis. Aortic Valve: The aortic valve is tricuspid. Aortic valve regurgitation is trivial. No aortic stenosis is present. Pulmonic Valve: The pulmonic valve was not well visualized. Pulmonic valve regurgitation is not visualized. No evidence of pulmonic stenosis. Aorta: The aortic root and ascending aorta are structurally normal, with no evidence of dilitation. Venous: The inferior vena cava is normal in size with greater than 50% respiratory variability, suggesting right atrial pressure of 3 mmHg. IAS/Shunts: The atrial septum is grossly normal.  LEFT VENTRICLE PLAX 2D LVIDd:         4.50 cm   Diastology LVIDs:         3.00 cm   LV e' medial:    14.00 cm/s LV PW:         1.20 cm   LV E/e' medial:  5.9 LV IVS:        1.10 cm   LV e' lateral:   16.50 cm/s LVOT diam:     2.00 cm   LV E/e' lateral: 5.0 LV SV:         104 LV SV Index:   48 LVOT Area:     3.14 cm  RIGHT VENTRICLE             IVC RV Basal diam:  4.00 cm     IVC diam: 1.20 cm RV Mid diam:    3.80 cm RV S prime:     17.70 cm/s TAPSE (M-mode): 3.0 cm LEFT ATRIUM             Index        RIGHT ATRIUM           Index LA diam:        4.30 cm 1.99 cm/m   RA Area:      15.20 cm LA Vol (A2C):   30.9 ml 14.33 ml/m  RA Volume:   34.00 ml  15.77 ml/m LA Vol (A4C):   59.3 ml 27.50 ml/m LA Biplane Vol: 44.8 ml 20.77 ml/m  AORTIC VALVE LVOT Vmax:   167.00 cm/s LVOT Vmean:  100.000 cm/s LVOT VTI:    0.332 m  AORTA Ao Root diam: 3.00 cm Ao Asc diam:  3.00 cm MITRAL VALVE MV Area (PHT): 2.83 cm    SHUNTS MV Decel Time: 268 msec    Systemic VTI:  0.33 m MV E velocity: 83.20 cm/s  Systemic Diam: 2.00 cm MV A velocity: 97.20 cm/s MV E/A ratio:  0.86 Stanly Leavens MD Electronically signed by Stanly Leavens MD Signature Date/Time: 02/20/2024/11:01:38 AM    Final    CARDIAC CATHETERIZATION Result Date: 02/19/2024 Conclusions: Severe single-vessel coronary artery disease with sequential 80-90% and heavily calcified 50% proximal/mid LAD stenoses.  LCx and RCA are without significant disease. Mildly elevated left ventricular filling pressure (LVEDP 20 mmHg). 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  This persisted throughout the case with an underlying junctional escape rhythm (  patient has RBBB at baseline). Successful IVUS-guided PCI to the proximal/mid LAD using a Synergy XD 3.0 x 28 mm drug-eluting stent with 0% residual stenosis and TIMI-3 flow. Recommendations: Overnight observation in the setting of continued complete heart block following PCI to the LAD.  If heart block does not improve with washout of metoprolol , EP consultation will need to be considered prior to discharge. Dual antiplatelet therapy with aspirin and clopidogrel for at least 6 months. Continue aggressive secondary prevention of coronary artery disease. Obtain echocardiogram. Lonni Hanson, MD [facility]  CT CORONARY [facility] W/CTA COR W/SCORE DEL W/CM &/OR WO/CM Addendum Date: 02/17/2024 ADDENDUM REPORT: 02/17/2024 15:02 EXAM: OVER-READ INTERPRETATION  CT CHEST The following report is an over-read performed by radiologist Dr. Ree Levy [facility] on  02/17/2024. This over-read does not include interpretation of cardiac or coronary anatomy or pathology. The coronary CTA interpretation by the cardiologist is attached. COMPARISON:  CT scan abdomen and pelvis from 11/26/2011. FINDINGS: Mediastinum/Nodes: No solid / cystic mediastinal masses. The visualized esophagus is nondistended precluding optimal assessment. No thoracic lymphadenopathy by size criteria. Lungs/Pleura: Imaged tracheo-bronchial tree is patent. There are dependent changes in bilateral lungs. No mass or consolidation. No pleural effusion or pneumothorax. There is a 5 mm sized noncalcified nodule in the right lower lobe, abutting the right major fissure (series 304, image 103), which is unchanged since the prior study from 2013 and favored benign. However, there are 2 adjacent noncalcified nodules in the right lung lower lobe, inferolaterally measuring up to 3 x 6 and 4 x 5 mm (series 304, images 179 and 184), which are new since the prior study from 2013. Please see follow-up recommendations below. Upper Abdomen: The subcapsular sub 5 mm hypoattenuating focus in the left hepatic dome (series 303, image 218), which is too small to adequately characterize. Remaining visualized upper abdominal viscera within normal limits. Musculoskeletal: The visualized soft tissues of the chest wall are grossly unremarkable. There is mild loss of height of lower thoracic vertebrae (exit number is difficult to determine due to limited field of view). There is sclerosis along the superior endplate of vertebral body and sclerosis of the adjacent vertebral endplate. There is age indeterminate fracture of the anterior bridging osteophyte. There is small defect along the anterior superior corner of the vertebral body. Findings are concerning for acute versus subacute vertebral fracture. Correlate clinically to determine the need for additional imaging with dedicated thoracic spine MRI or CT scan. IMPRESSION: 1. There are 2  new solid noncalcified nodules in the right lung lower lobe, inferolaterally measuring up to 6 mm. Please see follow-up recommendations below. 2. Mild superior endplate compression deformity of lower thoracic vertebrae and age indeterminate fracture of the anterior bridging osteophyte/anterosuperior corner of the vertebral body, as discussed in detail above. Correlate clinically to determine the need for additional imaging with dedicated thoracic spine MRI or CT scan. 3. Multiple other nonacute observations, as described above. Pulmonary nodule follow-up recommendation: Solid lung nodules measuring up to 6 mm: - LOW RISK: No routine follow-up. - HIGHER RISK: Optional CT at 12 months. Recommendations for evaluation of incidental nodules derived from guidelines developed by the Fleischner Society ( Radiology 2017; [phone]). These guidelines apply to incidental nodules, which can be managed according to the specific recommendations. These guidelines do not apply to patient's younger than 35 years, immunocompromised patients, or patients with cancer. For lung cancer screening, adherence to the existing [employer] of radiology lung CT Screening Reporting and Data System (lung-RADS) guidelines  is recommended. High risk factors include older age, heavy smoking, larger nodule size, irregular or spiculated margins and upper lobe location. Clinical risk factors include smoking, exposure to other carcinogens, emphysema, fibrosis, upper lobe location, family history of lung cancer, age and sex. Electronically Signed   By: Ree Molt M.D.   On: 02/17/2024 15:02   Result Date: 02/17/2024 CLINICAL DATA:  This a 83 year old male with anginal symptoms EXAM: Cardiac/Coronary  CTA TECHNIQUE: The patient was scanned on a [employer]. FINDINGS: A 100 kV prospective scan was triggered in the descending thoracic aorta at 111 HU's. Axial non-contrast 3 mm slices were carried out through the heart. The data set  was analyzed on a dedicated work station and scored using the Agatson method. Gantry rotation speed was 250 msecs and collimation was .6 mm. No beta blockade and 0.8 mg of sl NTG was given. The 3D data set was reconstructed in 5% intervals of the 67-82 % of the R-R cycle. Diastolic phases were analyzed on a dedicated work station using MPR, MIP and VRT modes. The patient received 80 cc of contrast. Aorta: Normal size.  No calcifications.  No dissection. Aortic Valve:  Trileaflet.  No calcifications. Coronary Arteries:  Normal coronary origin.  Right Dominance. RCA is a large dominant artery that gives rise to PDA and PLA. There is mild (25-49%) calcified plaque in the proximal RCA. The mid and distal RCA with no plaques. Moderate (50-69%) soft plaque in the proximal portion of the PDA. Left main is a large artery that gives rise to LAD and LCX arteries. Distal left main with focal mild calcified plaque. LAD is a large vessel. The proximal LAD with a mild focal calcification. Immediately below the mild focal calcified plaque lies a moderate soft plaques that extends in to a severe calcified plaque with noted blooming artifact. At the proximal to mid LAD is a focal mild calcified plaque. D1, D2 and D3 all medium sized vessels with no plaques. LCX is a non-dominant artery that gives rise to one large OM1 branch. There is no plaque. First obtuse marginal with no plaques. Coronary Calcium Score: Left main: 22 Left anterior descending artery: 259 Left circumflex artery: 0 Right coronary artery: 94.4 Total: 376 Percentile: 42 Other findings: Normal pulmonary vein drainage into the left atrium. Normal left atrial appendage without a thrombus. Normal size of the pulmonary artery. IMPRESSION: 1. Coronary calcium score of 376. This was 14 percentile for age and sex matched control. 2. Normal coronary origin with right dominance. 3. CAD-RADS 4 Severe stenosis. (70-99% or > 50% left main). Cardiac catheterization is recommended.  CT FFR will also be ordered. In addition, consider symptom-guided anti-ischemic pharmacotherapy as well as risk factor modification per guideline directed care. The noncardiac portion of this study will be interpreted in separate report by the radiologist. Electronically Signed: By: Kardie  Tobb D.O. On: 02/12/2024 17:12   CT CORONARY FRACTIONAL FLOW RESERVE FLUID ANALYSIS Result Date: 02/13/2024 EXAM: CT FFR ANALYSIS CLINICAL DATA:  cad FINDINGS: FFRct analysis was performed on the original cardiac CT angiogram dataset. Diagrammatic representation of the FFRct analysis is provided in a separate PDF document in PACS. This dictation was created using the PDF document and an interactive 3D model of the results. 3D model is not available in the EMR/PACS. Normal FFR range is >0.80. Indeterminate (grey) zone is 0.76-0.80. 1. Left Main: FFR = 1.00 2. LAD: Proximal FFR = 1.00, mid FFR = 0.57 3. LCX: Proximal FFR =  0.99, distal FFR = 0.97 4. RCA: Proximal FFR = 0.96, mid FFR =0.95, distal FFR = 0.95 IMPRESSION: 1. CT FFR analysis showed significant stenosis in the proximal LAD. RECOMMENDATIONS: Cardiac Catheterization. Electronically Signed   By: Kardie  Tobb D.O.   On: 02/13/2024 14:59    Disposition Pt is being discharged home today in good condition.  Follow-up Plans & Appointments  Discharge Instructions     Amb Referral to Cardiac Rehabilitation   Complete by: As directed    Diagnosis: Coronary Stents   After initial evaluation and assessments completed: Virtual Based Care may be provided alone or in conjunction with Phase 2 Cardiac Rehab based on patient barriers.: Yes   Intensive Cardiac Rehabilitation (ICR) [facility] location only OR Traditional Cardiac Rehabilitation (TCR) *If criteria for ICR are not met will enroll in TCR [facility] only): Yes   Call MD for:  difficulty breathing, headache or visual disturbances   Complete by: As directed    Call MD for:  persistant dizziness or light-headedness    Complete by: As directed    Call MD for:  redness, tenderness, or signs of infection (pain, swelling, redness, odor or green/yellow discharge around incision site)   Complete by: As directed    Diet - low sodium heart healthy   Complete by: As directed    Discharge instructions   Complete by: As directed    Radial Site Care Refer to this sheet in the next few weeks. These instructions provide you with information on caring for yourself after your procedure. Your caregiver may also give you more specific instructions. Your treatment has been planned according to current medical practices, but problems sometimes occur. Call your caregiver if you have any problems or questions after your procedure. HOME CARE INSTRUCTIONS You may shower the day after the procedure. Remove the bandage (dressing) and gently wash the site with plain soap and water. Gently pat the site dry.  Do not apply powder or lotion to the site.  Do not submerge the affected site in water for 3 to 5 days.  Inspect the site at least twice daily.  Do not flex or bend the affected arm for 24 hours.  No lifting over 5 pounds (2.3 kg) for 5 days after your procedure.  Do not drive home if you are discharged the same day of the procedure. Have someone else drive you.  You may drive 24 hours after the procedure unless otherwise instructed by your caregiver.  What to expect: Any bruising will usually fade within 1 to 2 weeks.  Blood that collects in the tissue (hematoma) may be painful to the touch. It should usually decrease in size and tenderness within 1 to 2 weeks.  SEEK IMMEDIATE MEDICAL CARE IF: You have unusual pain at the radial site.  You have redness, warmth, swelling, or pain at the radial site.  You have drainage (other than a small amount of blood on the dressing).  You have chills.  You have a fever or persistent symptoms for more than 72 hours.  You have a fever and your symptoms suddenly get worse.  Your arm becomes  pale, cool, tingly, or numb.  You have heavy bleeding from the site. Hold pressure on the site.   No driving until cleared by cardiology in follow up   Increase activity slowly   Complete by: As directed        Discharge Medications Allergies as of 02/21/2024   No Known Allergies  Medication List     STOP taking these medications    lisinopril 20 MG tablet Commonly known as: ZESTRIL   metoprolol  tartrate 50 MG tablet Commonly known as: LOPRESSOR        TAKE these medications    amLODipine 5 MG tablet Commonly known as: NORVASC Take 1 tablet (5 mg total) by mouth daily.   Aspirin 81 MG Caps Take 81 mg by mouth daily.   atorvastatin 80 MG tablet Commonly known as: LIPITOR Take 1 tablet (80 mg total) by mouth daily.   B-12 1000 MCG Caps Take 1,000 mcg by mouth daily.   clopidogrel 75 MG tablet Commonly known as: PLAVIX Take 1 tablet (75 mg total) by mouth daily with breakfast. Start taking on: February 22, 2024   CoQ10 200 MG Caps Take 200 mg by mouth daily.   Fish Oil 1000 MG Caps Take 1,000 mg by mouth daily.   Gas-X 80 MG chewable tablet Generic drug: simethicone Chew 80 mg by mouth every 6 (six) hours as needed for flatulence.   MiraLax 17 GM/SCOOP powder Generic drug: polyethylene glycol powder Take 17 g by mouth daily.   nitroGLYCERIN 0.4 MG SL tablet Commonly known as: NITROSTAT Place 1 tablet (0.4 mg total) under the tongue every 5 (five) minutes as needed for chest pain.   PRESERVISION AREDS 2 PO Take 1 tablet by mouth daily.   Vitamin D 50 MCG (2000 UT) tablet Take 2,000 Units by mouth daily.         Outstanding Labs/Studies  BMEt at follow up  Duration of Discharge Encounter: APP Time: 15 minutes   Signed, Manuelita Rummer, NP 02/21/2024, 1:09 PM   Patient seen, examined. Available data reviewed. Agree with findings, assessment, and plan as outlined by Manuelita Rummer, NP.  Please see my progress note the same date.   The patient is independently interviewed and examined.  On exam he is alert, oriented, in no distress, HEENT is normal, JVP is normal, lungs clear bilaterally, heart is bradycardic and regular with no murmur, abdomen is soft/nontender, extremities have no edema, the right radial cath site is clear with minimal forearm ecchymoses but no firm hematoma or tenderness.  Telemetry shows 2: 1 AV block with a stable escape rhythm.  EP evaluation is reviewed and appreciated.  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  He is asymptomatic and recommendations have been made for life telemetry monitoring x 2 weeks.  Otherwise, medication recommendations as outlined above.  MD time spent conducting this discharge is 20 minutes and includes my personal exam of the patient, discharge counseling, review of his telemetry, review of his cardiac catheterization data and electrophysiology notes.  Ozell Fell, M.D. 02/21/2024 2:22 PM

## 2024-02-21 NOTE — Progress Notes (Signed)
 Live Zio 14d ordered

## 2024-02-22 ENCOUNTER — Telehealth: Payer: Self-pay

## 2024-02-22 NOTE — Transitions of Care (Post Inpatient/ED Visit) (Signed)
 02/22/2024  Name: Ian Ritter MRN: [id number] DOB: [birth year]  Today's TOC FU Call Status: Today's TOC FU Call Status:: Successful TOC FU Call Completed TOC FU Call Complete Date: 02/22/24 Patient's Name and Date of Birth confirmed.  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 How have you been since you were released from the hospital?: Better Any questions or concerns?: No  Items Reviewed: Did you receive and understand the discharge instructions provided?: Yes Medications obtained,verified, and reconciled?: Yes (Medications Reviewed) Any new allergies since your discharge?: No Dietary orders reviewed?: Yes Type of Diet Ordered:: low salt heart healthy Do you have support at home?: Yes People in Home [RPT]: spouse Name of Support/Comfort Primary Source: Thimothy Barretta  Medications Reviewed Today: Medications Reviewed Today     Reviewed by Hagop Mccollam E, RN (Registered Nurse) on 02/22/24 at 1253  Med List Status: <None>   Medication Order Taking? Sig Documenting Provider Last Dose Status Informant  amLODipine (NORVASC) 5 MG tablet [id number] Yes Take 1 tablet (5 mg total) by mouth daily. Henry Shaver B, NP  Active   Aspirin 81 MG CAPS [id number] Yes Take 81 mg by mouth daily. [provider]  Active Self  atorvastatin (LIPITOR) 80 MG tablet [id number] Yes Take 1 tablet (80 mg total) by mouth daily. Goodrich, Callie E, PA-C  Active Self  Cholecalciferol (VITAMIN D) 50 MCG (2000 UT) tablet [id number] Yes Take 2,000 Units by mouth daily. [provider]  Active Self  clopidogrel (PLAVIX) 75 MG tablet [id number] Yes Take 1 tablet (75 mg total) by mouth daily with breakfast. Henry Shaver B, NP  Active   Coenzyme Q10 (COQ10) 200 MG CAPS [id number] Yes Take 200 mg by mouth  daily. [provider]  Active Self  Cyanocobalamin (B-12) 1000 MCG CAPS [id number] Yes Take 1,000 mcg by mouth daily. [provider]  Active Self  Multiple Vitamins-Minerals (PRESERVISION AREDS 2 PO) [id number] Yes Take 1 tablet by mouth daily. [provider]  Active Self  nitroGLYCERIN (NITROSTAT) 0.4 MG SL tablet [id number] Yes Place 1 tablet (0.4 mg total) under the tongue every 5 (five) minutes as needed for chest pain. Goodrich, Callie E, PA-C  Active Self  Omega-3 Fatty Acids (FISH OIL) 1000 MG CAPS [id number] Yes Take 1,000 mg by mouth daily. [provider]  Active Self  polyethylene glycol powder (MIRALAX) 17 GM/SCOOP powder [id number] Yes Take 17 g by mouth daily. [provider]  Active Self  simethicone (GAS-X) 80 MG chewable tablet [id number] Yes Chew 80 mg by mouth every 6 (six) hours as needed for flatulence. [provider]  Active Self            Home Care and Equipment/Supplies: Were Home Health Services Ordered?: No Any new equipment or medical supplies ordered?: No  Functional Questionnaire: Do you need assistance with bathing/showering or dressing?: No Do you need assistance with meal preparation?: No Do you need assistance with eating?: No Do you have difficulty maintaining continence: No Do you need assistance with getting out of bed/getting out of a chair/moving?: No Do you have difficulty managing or taking your medications?: No  Follow up appointments reviewed: PCP Follow-up appointment confirmed?:  (offered to assist patient with scheduling hospital follow up visit.  Patient declined. Advised patient to contact primary care provider office regarding hospital follow up visit.) Specialist Hospital Follow-up appointment confirmed?:  Yes Date of Specialist follow-up appointment?: 02/28/24 Follow-Up Specialty Provider:: Aline Door, PA Do you need transportation to your follow-up appointment?: No Do you understand  care options if your condition(s) worsen?: Yes-patient verbalized understanding  SDOH Interventions Today    Flowsheet Row Most Recent Value  SDOH Interventions   Food Insecurity Interventions Intervention Not Indicated  Housing Interventions Intervention Not Indicated  Transportation Interventions Intervention Not Indicated  Utilities Interventions Intervention Not Indicated   Discussed and offered 30 day TOC program.  Patient  declined.  The patient has been provided with contact information for the care management team and has been advised to call with any health -related questions or concerns.  The patient verbalized understanding with current plan of care.  The patient is directed to their insurance card regarding availability of benefits coverage.    Arvin Seip RN, BSN, CCM [employer], Population Health Case Manager Phone: [phone]

## 2024-02-22 NOTE — Patient Instructions (Signed)
 Visit Information  Thank you for taking time to visit with me today. Please don't hesitate to contact me if I can be of assistance to you   Patient instructions per discharge summary: Discharge instructions Radial Site Care Refer to this sheet in the next few weeks. These instructions provide you with information on caring for yourself after your procedure. Your caregiver may also give you more specific instructions. Your treatment has been planned according to current medical practices, but problems sometimes occur. Call your caregiver if you have any problems or questions after your procedure. HOME CARE INSTRUCTIONS You may shower the day after the procedure. Remove the bandage (dressing) and gently wash the site with plain soap and water. Gently pat the site dry. Do not apply powder or lotion to the site. Do not submerge the affected site in water for 3 to 5 days. Inspect the site at least twice daily. Do not flex or bend the affected arm for 24 hours. No lifting over 5 pounds (2.3 kg) for 5 days after your procedure. Do not drive home if you are discharged the same day of the procedure. Have someone else drive you. You may drive 24 hours after the procedure unless otherwise instructed by your caregiver. What to expect: Any bruising will usually fade within 1 to 2 weeks. Blood that collects in the tissue (hematoma) may be painful to the touch. It should usually decrease in size and tenderness within 1 to 2 weeks. SEEK IMMEDIATE MEDICAL CARE IF: You have unusual pain at the radial site. You have redness, warmth, swelling, or pain at the radial site. You have drainage (other than a small amount of blood on the dressing). You have chills. You have a fever or persistent symptoms for more than 72 hours. You have a fever and your symptoms suddenly get worse. Your arm becomes pale, cool, tingly, or numb. You have heavy bleeding from the site. Hold pressure on the site. No driving until  cleared by cardiology in follow up Increase activity slowly  Patient verbalizes understanding of instructions and care plan provided today and agrees to view in MyChart. Active MyChart status and patient understanding of how to access instructions and care plan via MyChart confirmed with patient.     The patient has been provided with contact information for the care management team and has been advised to call with any health related questions or concerns.   Please call the care guide team at [phone] if you need to cancel or reschedule your appointment.   Please call the Suicide and Crisis Lifeline: 988 call the [place]  National Suicide Prevention Lifeline: 585-010-7763 or TTY: (212)548-3454 TTY 7327976352) to talk to a trained counselor call 1-800-273-TALK (toll free, 24 hour hotline) if you are experiencing a Mental Health or Behavioral Health Crisis or need someone to talk to.  Arvin Seip RN, BSN, CCM [employer], Population Health Case Manager Phone: [phone]

## 2024-02-22 NOTE — Telephone Encounter (Signed)
 Sam with [employer] following up regarding abnormal results.    Phone#: [phone] (reference#: [id number])

## 2024-02-22 NOTE — Telephone Encounter (Signed)
 Spoke to [employer], informed of recommendation.  They already received a call form our office on  this with same recommendations.

## 2024-02-22 NOTE — Telephone Encounter (Signed)
 Multiple runs complete heart block, averaging 42-49 BPM between 1315-1655 on 02/21/24. Results have been uploaded to the system.

## 2024-02-27 ENCOUNTER — Telehealth: Payer: Self-pay | Admitting: Cardiovascular Disease

## 2024-02-27 ENCOUNTER — Telehealth (HOSPITAL_COMMUNITY): Payer: Self-pay

## 2024-02-27 NOTE — Telephone Encounter (Signed)
Calling with abnormal results. Please advise  

## 2024-02-27 NOTE — Telephone Encounter (Signed)
   Cardiac Monitor Alert  Date of alert:  02/27/2024   Patient Name: Ian Ritter  DOB: [birth year]  MRN: [id number]   [facility] Cardiologist: Darryle ONEIDA Decent, MD  [facility] EP:  None    Monitor Information: Long Term Monitor-Live Telemetry [ZioAT]  Reason:  Heart Block Ordering provider:  Manuelita Rummer, NP   Alert Complete Heart Block This is the 4th alert for this rhythm.   Next Cardiology Appointment   Date:  02/28/24  Provider:  Aline Door, PA-C  The patient was contacted today.  He is asymptomatic. Arrhythmia, symptoms and history reviewed with DOD Dr. Kate.   Plan:  Patient has appt tomorrow, Dr. Kate advises that patient continue with plan of care.   Other: Spoke with patient who states he has a history of complete heart block and is asymptomatic today. He verbalizes understanding to take it easy and do what I can with a heart rate in the 40's. Patient is aware of appt tomorrow and with EP department in early December.   Geni LITTIE Sar, RN  02/27/2024 2:03 PM

## 2024-02-27 NOTE — Telephone Encounter (Signed)
 Called Ian Ritter to see if he was interested in Cardiac Rehab, LMTCB

## 2024-02-27 NOTE — Telephone Encounter (Signed)
 Cardiac Monitor Alert   Date of alert:  02/27/2024    Patient Name: Ian Ritter  DOB: Jun 21, 1940  MRN: [id number]    [facility] Cardiologist: Darryle ONEIDA Decent, MD  [facility] EP:  None     Monitor Information: Long Term Monitor-Live Telemetry [ZioAT]  Reason:  Heart Block Ordering provider:  Manuelita Rummer, NP   Alert Complete Heart Block This is the 5th alert for this rhythm.    Next Cardiology Appointment   Date:  02/28/24  Provider:  Aline Door, PA-C   The patient was contacted today.  He is asymptomatic. Arrhythmia, symptoms and history reviewed with DOD Dr. Kate.   Plan:  Continue with previous plan of care    Other: Pt reports that he is feeling fine and is taking Plavix and Aspirin as prescribed.   Lyle Rigg, RN 02/27/2024 5:06PM

## 2024-02-27 NOTE — Telephone Encounter (Signed)
 Sam with [employer] calling to report abnormal EKG

## 2024-02-28 ENCOUNTER — Telehealth: Payer: Self-pay | Admitting: Cardiovascular Disease

## 2024-02-28 ENCOUNTER — Ambulatory Visit: Attending: Student | Admitting: Student

## 2024-02-28 ENCOUNTER — Encounter: Payer: Self-pay | Admitting: Student

## 2024-02-28 VITALS — BP 126/64 | HR 49 | Ht 71.0 in | Wt 216.0 lb

## 2024-02-28 DIAGNOSIS — E785 Hyperlipidemia, unspecified: Secondary | ICD-10-CM

## 2024-02-28 DIAGNOSIS — I2510 Atherosclerotic heart disease of native coronary artery without angina pectoris: Secondary | ICD-10-CM

## 2024-02-28 DIAGNOSIS — I442 Atrioventricular block, complete: Secondary | ICD-10-CM | POA: Diagnosis not present

## 2024-02-28 DIAGNOSIS — I10 Essential (primary) hypertension: Secondary | ICD-10-CM

## 2024-02-28 DIAGNOSIS — N1831 Chronic kidney disease, stage 3a: Secondary | ICD-10-CM

## 2024-02-28 NOTE — Telephone Encounter (Signed)
   Cardiac Monitor Alert  Date of alert:  02/28/2024   Patient Name: Ian Ritter  DOB: Dec 19, 1940  MRN: [id number]   [facility] Cardiologist: Darryle ONEIDA Decent, MD  [facility] EP:  None    Monitor Information: Long Term Monitor-Live Telemetry [ZioAT]  Reason:  AV Block Ordering provider:  [name]   Alert Complete Heart Block This is a known alert for this rhythm.   Next Cardiology Appointment   Date:  03/22/2024  Provider:  [name]  The patient was contacted today.  He is asymptomatic. Arrhythmia, symptoms and history reviewed with Dr Floretta SIMMERING.   Plan:  Continue monitoring with ED precautions.     [name], RN  02/28/2024 4:05 PM

## 2024-02-28 NOTE — Telephone Encounter (Signed)
 Caller (Sam) is reporting patient's abnormal results.

## 2024-02-28 NOTE — Patient Instructions (Signed)
 Medication Instructions:  NO CHANGES  Lab Work: BMET TO BE DONE TODAY. FASTING LIPID PANEL AND LFTs TO BE DONE IN 2 MONTHS. (AROUND 04-29-24)  Testing/Procedures: NONE  Follow-Up: At [facility], you and your health needs are our priority.  As part of our continuing mission to provide you with exceptional heart care, our providers are all part of one team.  This team includes your primary Cardiologist (physician) and Advanced Practice Providers or APPs (Physician Assistants and Nurse Practitioners) who all work together to provide you with the care you need, when you need it.  Your next appointment:   KEEP EP APPOINTMENT WITH BRANDI OLLIS KEEP JANUARY APPOINTMENT WITH DR. O'NEAL.  Provider:   Darryle ONEIDA Decent, MD

## 2024-02-29 ENCOUNTER — Telehealth: Payer: Self-pay | Admitting: Cardiovascular Disease

## 2024-02-29 LAB — BASIC METABOLIC PANEL WITH GFR
BUN/Creatinine Ratio: 12 (ref 10–24)
BUN: 15 mg/dL (ref 8–27)
CO2: 23 mmol/L (ref 20–29)
Calcium: 10.3 mg/dL — ABNORMAL HIGH (ref 8.6–10.2)
Chloride: 103 mmol/L (ref 96–106)
Creatinine, Ser: 1.26 mg/dL (ref 0.76–1.27)
Glucose: 105 mg/dL — ABNORMAL HIGH (ref 70–99)
Potassium: 4.2 mmol/L (ref 3.5–5.2)
Sodium: 143 mmol/L (ref 134–144)
eGFR: 57 mL/min/1.73 — ABNORMAL LOW (ref >59)

## 2024-02-29 NOTE — Telephone Encounter (Signed)
 Caller (Ron) is reporting abnormal EKG results.   Ref# [id number]

## 2024-02-29 NOTE — Telephone Encounter (Signed)
 Received STAT alert from [employer] regarding known Complete Heart Block.   ALERT: Today (02/29/24) at 9:27 am and then again at 12:45 pm, Complete heart block Avg HR 40 BPM, first alert lasted 90 sec , second alert lasted 60 seconds.   [employer] contacted pt this morning, pt felt fine.  Triage RN contacted pt this afternoon. Pt stated he has not had any recurrent Chest pain/shortness of breath since leaving the hospital on 02/19/24. This morning he was running errands and around 12:45 pm he had walked/taken the garbage can to the curb. He has been taking it easy and not over exerting himself. He denies any other symptoms. ER Precautions reviewed with pt who verbalized understanding.   Will route to Dr. Barbaraann who is in clinic today. (Pt of his); Spoke to Dr. Barbaraann, okay to continue current plan as pt is not having symptoms.

## 2024-03-01 ENCOUNTER — Telehealth: Payer: Self-pay

## 2024-03-01 NOTE — Telephone Encounter (Signed)
 I received an alert from [employer] home monitoring reporting an event of CHB at 12:44 AM with ventricular rhythm as 35 to 44 bpm.  It appears that this issue is already known as the patient was discharged from hospital with CHB and plan to see EP as outpatient.  This note is for documentation purposes and there is no need for acute intervention at this time.

## 2024-03-03 ENCOUNTER — Ambulatory Visit: Payer: Self-pay | Admitting: Student

## 2024-03-04 ENCOUNTER — Telehealth: Payer: Self-pay

## 2024-03-04 NOTE — Telephone Encounter (Addendum)
   Received a page from [employer]: Multiple episodes of complete heart block totaling ~16 minutes. [employer] did get in touch with patient who shared he was asymptomatic.   On chart review, live monitor placed on 11/5 at discharge after developing asymptomatic complete heart block after PCI, EP was consulted and deferred pacemaker placement for now. EP follow up scheduled for 12/4.  Attempted to call patient x3. Will attempt to call again this evening  Leontine LOISE Salen, PA-C 03/04/2024, 6:06 PM   Called patient x1 and spouse x 1 at 1900  Spoke with patient at 1946 he reported being asymptomatic. Did notice shortness of breath with exertion up the stairs. Denied all other symptoms. Reported he feels the same since discharge.  I advised patient if he developed symptoms to report to the ED otherwise okay to wait for his EP visit on 12/4.    Caller verbalized understanding and was grateful for the call back.

## 2024-03-05 ENCOUNTER — Telehealth: Payer: Self-pay | Admitting: Cardiovascular Disease

## 2024-03-05 NOTE — Telephone Encounter (Signed)
 [employer] calling to report critical reports. Call Transferred

## 2024-03-05 NOTE — Telephone Encounter (Signed)
 I booked an earlier EP appointment for this patient for 03/07/24 at 10:40 am per Dr. Barbaraann. I called the patient and let him know of that.

## 2024-03-05 NOTE — Telephone Encounter (Signed)
 After receiving a critical monitor alert from [employer]. Which was for complete heart block at 11:50 am and 2:49 pm which last 90 seconds. I called and spoke with the patient. He states he is doing fine and has no symptoms. No signs of chest pain, shortness of breath, dizziness or lightheadedness. He says he is sitting in his recliner relaxing. I reminded him of his upcoming appointment and let them know to reach out if they need anything. They are aware of ED precautions and I will let DOD know that this is the second alert we received today.

## 2024-03-05 NOTE — Telephone Encounter (Signed)
 Tobias is calling from [facility] regarding abnormal EKG. Call transferred.

## 2024-03-05 NOTE — Telephone Encounter (Signed)
 Spoke with Tobias from [facility]. She stated that the patient experienced complete heart block that was 39 bpm and lasted 90 seconds at 8 am this morning. There were 3 other incidents at 7:24 am, 9:36 am and 9:38 am.   I called and spoke with the patient. He states he is doing well and was getting up for the day when the complete heart block occurred. He said tomorrow is the last day for the monitor and that he does experience some shortness of breath when he has to walk to his mail box. Besides that shortness of breath when walking a longer distance, he is not experiencing any other symptoms. This is something that started after his heart cath that was done on 02/19/24. MD is aware of this and I am letting the DOD know as well for further advise.   DOD suggests we move up his follow up with Daphne Barrack, the patient currently has an appointment with Daphne Barrack on 03/22/24. I will message her to see if she would like to add him to her schedule sooner.

## 2024-03-06 NOTE — Telephone Encounter (Signed)
 [employer] called and said the patient had another episode of complete heart block. There were 5 episodes in total and they were ranging from 1041 till 1053. They were 39 bpm-43 bpm and lasted 84-90 seconds. I called and spoke with the patient. He states that he did not have any symptoms and is doing well. He did take off his monitor now and will ship it back to the company. He was appreciative of the call and I will let his provider know of this monitor reading as well.

## 2024-03-06 NOTE — Telephone Encounter (Signed)
 I received a call for a critical monitor alert from [employer]. This call was for a complete heart block 39 bpm for 90 seconds at 8:19 am. I called the patient and he stated that he is asymptomatic and was just getting up for the day around that time. He stated that he is taking his monitor off at noon today and he is aware that he has an appointment with an EP APP tomorrow 11/20 at 1040. I let the DOD and Dr. Barbaraann know of this.

## 2024-03-06 NOTE — Telephone Encounter (Signed)
 Is calling back with abnormal results. Pleas advise

## 2024-03-06 NOTE — Telephone Encounter (Signed)
 Ronny with [employer] calling back for a new monitor alert

## 2024-03-07 ENCOUNTER — Ambulatory Visit: Attending: Student | Admitting: Student

## 2024-03-07 ENCOUNTER — Encounter: Payer: Self-pay | Admitting: Student

## 2024-03-07 VITALS — BP 140/66 | HR 42 | Ht 71.0 in | Wt 215.6 lb

## 2024-03-07 DIAGNOSIS — I10 Essential (primary) hypertension: Secondary | ICD-10-CM

## 2024-03-07 DIAGNOSIS — I2510 Atherosclerotic heart disease of native coronary artery without angina pectoris: Secondary | ICD-10-CM | POA: Diagnosis not present

## 2024-03-07 DIAGNOSIS — I442 Atrioventricular block, complete: Secondary | ICD-10-CM | POA: Diagnosis not present

## 2024-03-07 NOTE — Patient Instructions (Signed)
 Medication Instructions:  Your physician recommends that you continue on your current medications as directed. Please refer to the Current Medication list given to you today.  *If you need a refill on your cardiac medications before your next appointment, please call your pharmacy*  Lab Work: None ordered, you will need a CBC and BMET prior to the procedure but we will let to know when to have these drawn If you have labs (blood work) drawn today and your tests are completely normal, you will receive your results only by: MyChart Message (if you have MyChart) OR A paper copy in the mail If you have any lab test that is abnormal or we need to change your treatment, we will call you to review the results.  Testing/Procedures: We will be in contact to schedule the procedure and provide instructions  Follow-Up: At [facility], you and your health needs are our priority.  As part of our continuing mission to provide you with exceptional heart care, our providers are all part of one team.  This team includes your primary Cardiologist (physician) and Advanced Practice Providers or APPs (Physician Assistants and Nurse Practitioners) who all work together to provide you with the care you need, when you need it.  Your next appointment:   Follow up will be arranged for you

## 2024-03-07 NOTE — Progress Notes (Signed)
  Electrophysiology Office Note:   Date:  03/07/2024  ID:  Ian Ritter, DOB 1940-08-14, MRN [id number]  Primary Cardiologist: Darryle ONEIDA Decent, MD Electrophysiologist: None   Cardiology APP:  Jadine Aline BRAVO, PA-C      History of Present Illness:   Ian Ritter is a 83 y.o. male with h/o CAD s/p PCI (02/19/2024), HTN, HLD, transient thyrotoxicosis, CKD-3a, diverticulitis s/p resection & anastomosis (2007), IBS  seen today for acute visit due to heart monitor results.    Pt admitted 11/3 for PCI to his LAD. 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 EP consulted and recommended washout of lopressor .   Pt remains asymptomatic with stable junctional escape, and in shared decision was sent home with monitor.   Monitor has continued to show episodes of CHB. Follow up moved up to re-assess symptoms and EP recommendations.   Patient reports OK. Does have some fatigue and SOB when walking short to moderate distances. No chest pain, edema, or syncope.   Review of systems complete and found to be negative unless listed in HPI.   EP Information / Studies Reviewed:    EKG is ordered today. Personal review as below.  EKG Interpretation Date/Time:  Thursday March 07 2024 10:20:05 EST Ventricular Rate:  42 PR Interval:    QRS Duration:  134 QT Interval:  546 QTC Calculation: 455 R Axis:   77  Text Interpretation: Sinus rhythm with complete heart block and Wide QRS rhythm Right bundle branch block Confirmed by Lesia Sharper [phone]) on 03/07/2024 10:23:23 AM    Arrhythmia/Device History No specialty comments available.   Physical Exam:   VS:  BP (!) 140/66   Pulse (!) 42   Ht 5' 11 (1.803 m)   Wt 215 lb 9.6 oz (97.8 kg)   SpO2 98%   BMI 30.07 kg/m    Wt Readings from Last 3 Encounters:  03/07/24 215 lb 9.6 oz (97.8 kg)  02/28/24 216 lb (98 kg)  02/19/24 210 lb 14.4 oz (95.7 kg)     GEN: No acute distress NECK: No JVD; No  carotid bruits CARDIAC: Regular rate and rhythm, no murmurs, rubs, gallops RESPIRATORY:  Clear to auscultation without rales, wheezing or rhonchi  ABDOMEN: Soft, non-tender, non-distended EXTREMITIES:  No edema; No deformity   ASSESSMENT AND PLAN:    CHB EKG today shows persistent CHB Review of strips shows more likely persistent CHB; do not think he is going in and out.  Explained risks, benefits, and alternatives to PPM implantation, including but not limited to bleeding, infection, pneumothorax, pericardial effusion, lead dislodgement, heart attack, stroke, or death.  Pt verbalized understanding and agrees to proceed.      Will not be able to hold plavix given recent stent. Pt understands slightly increased risk of bleeding and infection.    CAD s/p PCI on 02/19/2024 No s/s of ischemia.     If needs pacing, will not be able to interrupt plavix given very recent stent  HTN Stable on current regimen  Avoid AV nodal agents.     Follow up with EP Team as usual post procedure  Signed, Sharper Prentice Lesia, PA-C

## 2024-03-07 NOTE — H&P (View-Only) (Signed)
  Electrophysiology Office Note:   Date:  03/07/2024  ID:  Ian Ritter, DOB 1940-08-14, MRN [id number]  Primary Cardiologist: Darryle ONEIDA Decent, MD Electrophysiologist: None   Cardiology APP:  Jadine Aline BRAVO, PA-C      History of Present Illness:   Ian Ritter is a 83 y.o. male with h/o CAD s/p PCI (02/19/2024), HTN, HLD, transient thyrotoxicosis, CKD-3a, diverticulitis s/p resection & anastomosis (2007), IBS  seen today for acute visit due to heart monitor results.    Pt admitted 11/3 for PCI to his LAD. 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 EP consulted and recommended washout of lopressor .   Pt remains asymptomatic with stable junctional escape, and in shared decision was sent home with monitor.   Monitor has continued to show episodes of CHB. Follow up moved up to re-assess symptoms and EP recommendations.   Patient reports OK. Does have some fatigue and SOB when walking short to moderate distances. No chest pain, edema, or syncope.   Review of systems complete and found to be negative unless listed in HPI.   EP Information / Studies Reviewed:    EKG is ordered today. Personal review as below.  EKG Interpretation Date/Time:  Thursday March 07 2024 10:20:05 EST Ventricular Rate:  42 PR Interval:    QRS Duration:  134 QT Interval:  546 QTC Calculation: 455 R Axis:   77  Text Interpretation: Sinus rhythm with complete heart block and Wide QRS rhythm Right bundle branch block Confirmed by Lesia Sharper [phone]) on 03/07/2024 10:23:23 AM    Arrhythmia/Device History No specialty comments available.   Physical Exam:   VS:  BP (!) 140/66   Pulse (!) 42   Ht 5' 11 (1.803 m)   Wt 215 lb 9.6 oz (97.8 kg)   SpO2 98%   BMI 30.07 kg/m    Wt Readings from Last 3 Encounters:  03/07/24 215 lb 9.6 oz (97.8 kg)  02/28/24 216 lb (98 kg)  02/19/24 210 lb 14.4 oz (95.7 kg)     GEN: No acute distress NECK: No JVD; No  carotid bruits CARDIAC: Regular rate and rhythm, no murmurs, rubs, gallops RESPIRATORY:  Clear to auscultation without rales, wheezing or rhonchi  ABDOMEN: Soft, non-tender, non-distended EXTREMITIES:  No edema; No deformity   ASSESSMENT AND PLAN:    CHB EKG today shows persistent CHB Review of strips shows more likely persistent CHB; do not think he is going in and out.  Explained risks, benefits, and alternatives to PPM implantation, including but not limited to bleeding, infection, pneumothorax, pericardial effusion, lead dislodgement, heart attack, stroke, or death.  Pt verbalized understanding and agrees to proceed.      Will not be able to hold plavix given recent stent. Pt understands slightly increased risk of bleeding and infection.    CAD s/p PCI on 02/19/2024 No s/s of ischemia.     If needs pacing, will not be able to interrupt plavix given very recent stent  HTN Stable on current regimen  Avoid AV nodal agents.     Follow up with EP Team as usual post procedure  Signed, Sharper Prentice Lesia, PA-C

## 2024-03-12 ENCOUNTER — Telehealth: Payer: Self-pay | Admitting: Cardiology

## 2024-03-12 NOTE — Telephone Encounter (Signed)
 Pt would like a c/b to discuss scheduling pacemaker procedure. Please advise.

## 2024-03-12 NOTE — Telephone Encounter (Signed)
 Aware I will look into this and let him know.  Discussed we may have to schedule it with another MD in order to schedule in a timely manner. Pt agreeable to plan.

## 2024-03-17 ENCOUNTER — Encounter (HOSPITAL_COMMUNITY): Payer: Self-pay

## 2024-03-18 ENCOUNTER — Ambulatory Visit (HOSPITAL_COMMUNITY)

## 2024-03-19 ENCOUNTER — Other Ambulatory Visit (HOSPITAL_COMMUNITY): Payer: Self-pay

## 2024-03-19 DIAGNOSIS — I442 Atrioventricular block, complete: Secondary | ICD-10-CM

## 2024-03-19 NOTE — Addendum Note (Signed)
 Encounter addended by: Osbaldo Mark N on: 03/19/2024 5:09 PM  Actions taken: Imaging Exam ended

## 2024-03-19 NOTE — Addendum Note (Signed)
 Encounter addended by: Ranyia Witting N on: 03/19/2024 5:07 PM  Actions taken: Imaging Exam ended

## 2024-03-20 ENCOUNTER — Other Ambulatory Visit (HOSPITAL_COMMUNITY): Payer: Self-pay

## 2024-03-22 ENCOUNTER — Encounter: Payer: Self-pay | Admitting: *Deleted

## 2024-03-22 ENCOUNTER — Ambulatory Visit: Admitting: Pulmonary Disease

## 2024-03-22 LAB — BASIC METABOLIC PANEL WITH GFR
BUN/Creatinine Ratio: 13 (ref 10–24)
BUN: 17 mg/dL (ref 8–27)
CO2: 22 mmol/L (ref 20–29)
Calcium: 9.8 mg/dL (ref 8.6–10.2)
Chloride: 104 mmol/L (ref 96–106)
Creatinine, Ser: 1.26 mg/dL (ref 0.76–1.27)
Glucose: 96 mg/dL (ref 70–99)
Potassium: 4.3 mmol/L (ref 3.5–5.2)
Sodium: 142 mmol/L (ref 134–144)
eGFR: 57 mL/min/1.73 — AB (ref >59)

## 2024-03-22 LAB — CBC
Hematocrit: 40.2 % (ref 37.5–51.0)
Hemoglobin: 13 g/dL (ref 13.0–17.7)
MCH: 34.2 pg — ABNORMAL HIGH (ref 26.6–33.0)
MCHC: 32.3 g/dL (ref 31.5–35.7)
MCV: 106 fL — ABNORMAL HIGH (ref 79–97)
Platelets: 209 x10E3/uL (ref 150–450)
RBC: 3.8 x10E6/uL — ABNORMAL LOW (ref 4.14–5.80)
RDW: 13.6 % (ref 11.6–15.4)
WBC: 7.6 x10E3/uL (ref 3.4–10.8)

## 2024-03-22 NOTE — Telephone Encounter (Signed)
 Late entry:   Spoke to pt beginning of week.  PPM implant scheduled for 03/29/2024. Pt completed pre procedure blood work yesterday. Instruction letter sent to patient this morning, via mychart.  Pt agreeable to plan.

## 2024-03-25 ENCOUNTER — Telehealth: Payer: Self-pay

## 2024-03-25 ENCOUNTER — Encounter: Payer: Self-pay | Admitting: Emergency Medicine

## 2024-03-25 NOTE — Telephone Encounter (Signed)
-----   Message from Nurse Sherri P sent at 03/22/2024  9:47 AM EST ----- Regarding: 03/29/24  PPM implant Precert:  MD: Inocencio Type of implant: PPM Device manufacturer: rotation Diagnosis: heart block CPT code: PPM implant, dual - 66791 C-code(s), including quantity (if indicated): 1785, 1898 x2 Procedure scheduled (date/time): 03/29/24 3:30 pm  Procedure:  Scrub given? No (I placed orders for them to perform at hospital day of)  Medication instructions: take only morning medications (will not be holding Plavix  d/t recent PCI) Message sent to CVRR? N/a Added to calendar? Yes Orders entered? Yes Letter complete? Yes Scheduled with cath lab? Yes Labs ordered (CBC, BMET, PT/INR if on warfarin)? Yes Dye allergy? No Pre-meds ordered and instructions given? N/a Letter method: MyChart Special instructions:  H&P: 03/07/24  Follow-up:  Cassie/Angel, please schedule Routine.

## 2024-03-26 ENCOUNTER — Ambulatory Visit: Payer: Self-pay | Admitting: Cardiology

## 2024-03-28 NOTE — Pre-Procedure Instructions (Signed)
Instructed patient on the following items: Arrival time 1:00 Nothing to eat or drink after midnight No meds AM of procedure Responsible person to drive you home and stay with you for 24 hrs Wash with special soap night before and morning of procedure

## 2024-03-29 ENCOUNTER — Ambulatory Visit (HOSPITAL_COMMUNITY)

## 2024-03-29 ENCOUNTER — Ambulatory Visit (HOSPITAL_COMMUNITY)
Admission: RE | Admit: 2024-03-29 | Discharge: 2024-03-29 | Disposition: A | Attending: Cardiology | Admitting: Cardiology

## 2024-03-29 ENCOUNTER — Ambulatory Visit (HOSPITAL_COMMUNITY): Admission: RE | Disposition: A | Payer: Self-pay | Attending: Cardiology

## 2024-03-29 ENCOUNTER — Other Ambulatory Visit: Payer: Self-pay

## 2024-03-29 DIAGNOSIS — I129 Hypertensive chronic kidney disease with stage 1 through stage 4 chronic kidney disease, or unspecified chronic kidney disease: Secondary | ICD-10-CM | POA: Diagnosis not present

## 2024-03-29 DIAGNOSIS — I2510 Atherosclerotic heart disease of native coronary artery without angina pectoris: Secondary | ICD-10-CM | POA: Diagnosis not present

## 2024-03-29 DIAGNOSIS — Z955 Presence of coronary angioplasty implant and graft: Secondary | ICD-10-CM | POA: Diagnosis not present

## 2024-03-29 DIAGNOSIS — I442 Atrioventricular block, complete: Secondary | ICD-10-CM | POA: Insufficient documentation

## 2024-03-29 DIAGNOSIS — N1831 Chronic kidney disease, stage 3a: Secondary | ICD-10-CM | POA: Diagnosis not present

## 2024-03-29 HISTORY — PX: PACEMAKER IMPLANT: EP1218

## 2024-03-29 SURGERY — PACEMAKER IMPLANT
Anesthesia: LOCAL

## 2024-03-29 MED ORDER — SODIUM CHLORIDE 0.9 % IV SOLN
INTRAVENOUS | Status: AC
  Administered 2024-03-29: 80 mg
  Filled 2024-03-29: qty 2

## 2024-03-29 MED ORDER — CHLORHEXIDINE GLUCONATE 4 % EX SOLN
60.0000 mL | Freq: Once | CUTANEOUS | Status: DC
  Filled 2024-03-29: qty 60

## 2024-03-29 MED ORDER — POVIDONE-IODINE 10 % EX SWAB
2.0000 | Freq: Once | CUTANEOUS | Status: AC
  Administered 2024-03-29: 2 via TOPICAL

## 2024-03-29 MED ORDER — FENTANYL CITRATE (PF) 100 MCG/2ML IJ SOLN
INTRAMUSCULAR | Status: DC | PRN
  Administered 2024-03-29: 25 ug via INTRAVENOUS

## 2024-03-29 MED ORDER — CEFAZOLIN SODIUM-DEXTROSE 2-4 GM/100ML-% IV SOLN: 2.0000 g | INTRAVENOUS | Status: AC

## 2024-03-29 MED ORDER — SODIUM CHLORIDE 0.9 % IV SOLN: INTRAVENOUS | Status: DC

## 2024-03-29 MED ORDER — LIDOCAINE HCL 1 % IJ SOLN
INTRAMUSCULAR | Status: AC
  Filled 2024-03-29: qty 60

## 2024-03-29 MED ORDER — SODIUM CHLORIDE 0.9 % IV SOLN: 80.0000 mg | INTRAVENOUS | Status: AC

## 2024-03-29 MED ORDER — FENTANYL CITRATE (PF) 100 MCG/2ML IJ SOLN
INTRAMUSCULAR | Status: AC
  Filled 2024-03-29: qty 2

## 2024-03-29 MED ORDER — LIDOCAINE HCL (PF) 1 % IJ SOLN
INTRAMUSCULAR | Status: DC | PRN
  Administered 2024-03-29: 30 mL

## 2024-03-29 MED ORDER — ACETAMINOPHEN 325 MG PO TABS: 325.0000 mg | ORAL_TABLET | ORAL | Status: DC | PRN

## 2024-03-29 MED ORDER — CEFAZOLIN SODIUM-DEXTROSE 2-4 GM/100ML-% IV SOLN
INTRAVENOUS | Status: AC
  Administered 2024-03-29: 2 g via INTRAVENOUS
  Filled 2024-03-29: qty 100

## 2024-03-29 MED ORDER — MIDAZOLAM HCL 5 MG/5ML IJ SOLN
INTRAMUSCULAR | Status: DC | PRN
  Administered 2024-03-29: 16:00:00 1 mg via INTRAVENOUS

## 2024-03-29 MED ORDER — ONDANSETRON HCL 4 MG/2ML IJ SOLN: 4.0000 mg | Freq: Four times a day (QID) | INTRAMUSCULAR | Status: DC | PRN

## 2024-03-29 MED FILL — Midazolam HCl Inj 2 MG/2ML (Base Equivalent): INTRAMUSCULAR | Qty: 2 | Status: AC

## 2024-03-29 SURGICAL SUPPLY — 14 items
CABLE SURGICAL S-101-97-12 (CABLE) ×1 IMPLANT
CATH CPS LOCATOR 3D MED (CATHETERS) IMPLANT
CATH CPS LOCATOR 3D SM (CATHETERS) IMPLANT
LEAD ULTIPACE 52 LPA1231/52 (Lead) IMPLANT
LEAD ULTIPACE 65 LPA1231/65 (Lead) IMPLANT
PACEMAKER ASSURITY DR-RF (Pacemaker) IMPLANT
PAD DEFIB RADIO PHYSIO CONN (PAD) ×1 IMPLANT
SHEATH 7FR PRELUDE SNAP 13 (SHEATH) IMPLANT
SHEATH 9FR PRELUDE SNAP 13 (SHEATH) IMPLANT
SHEATH PROBE COVER 6X72 (BAG) IMPLANT
SLITTER AGILIS HISPRO (INSTRUMENTS) IMPLANT
TOOL HELIX LOCKING (MISCELLANEOUS) IMPLANT
TRAY PACEMAKER INSERTION (PACKS) ×1 IMPLANT
WIRE HI TORQ VERSACORE-J 145CM (WIRE) IMPLANT

## 2024-03-29 NOTE — Discharge Instructions (Addendum)
 After Your Pacemaker   You have a Abbott Pacemaker  If you have a Medtronic or Biotronik device, plug in your home monitor once you get home, and no manual interaction is required.   If you have an Abbott or [employer] device, plug your home monitor once you get home, sit near the device, and press the large activation button. Sit nearby until the process is complete, usually notated by lights on the monitor.   If you were set up for monitoring using an app on your phone, make sure the app remains open in the background and the Bluetooth remains on.  ACTIVITY Do not lift your arm above shoulder height for 1 week after your procedure. After 7 days, you may progress as below.  You should remove your sling 24 hours after your procedure, unless otherwise instructed by your provider.     Friday April 05, 2024  Saturday April 06, 2024 Sunday April 07, 2024 Monday April 08, 2024   Do not lift, push, pull, or carry anything over 10 pounds with the affected arm until 6 weeks (Friday May 10, 2024 ) after your procedure.   You may drive AFTER your wound check, unless you have been told otherwise by your provider.   Ask your healthcare provider when you can go back to work   INCISION/Dressing Pt on Plavix  + ASA **  If large square, outer bandage is left in place, this can be removed after 24 hours from your procedure. Do not remove steri-strips or glue as below.   If a PRESSURE DRESSING (a bulky dressing that usually goes up over your shoulder) was applied or left in place, please follow instructions given by your provider on when to return to have this removed.   Monitor your Pacemaker site for redness, swelling, and drainage. Call the device clinic at [phone] if you experience these symptoms or fever/chills.  If your incision is sealed with Dermabond (glue), you may shower 24 hrs after your procedure or when told by your provider. Do not remove the glue or let the  shower hit directly on your site. You may wash around your site with soap and water .    If you were discharged in a sling, please do not wear this during the day more than 48 hours after your surgery unless otherwise instructed. This may increase the risk of stiffness and soreness in your shoulder.   Avoid lotions, ointments, or perfumes over your incision until it is well-healed.  You may use a hot tub or a pool AFTER your wound check appointment if the incision is completely closed.  Pacemaker Alerts:  Some alerts are vibratory and others beep. These are NOT emergencies. Please call our office to let us  know. If this occurs at night or on weekends, it can wait until the next business day. Send a remote transmission.  If your device is capable of reading fluid status (for heart failure), you will be offered monthly monitoring to review this with you.   DEVICE MANAGEMENT Remote monitoring is used to monitor your pacemaker from home. This monitoring is scheduled every 91 days by our office. It allows us  to keep an eye on the functioning of your device to ensure it is working properly. You will routinely see your Electrophysiologist annually (more often if necessary).  This will appear as a REMOTE check on your MyChart schedule. These are automatic and there is nothing for you to manually do unless otherwise instructed.  You should receive  your ID card for your new device in 4-8 weeks. Keep this card with you at all times once received. Consider wearing a medical alert bracelet or necklace.  Your Pacemaker may be MRI compatible. This will be discussed at your next office visit/wound check.  You should avoid contact with strong electric or magnetic fields.   Do not use amateur (ham) radio equipment or electric (arc) welding torches. MP3 player headphones with magnets should not be used. Some devices are safe to use if held at least 12 inches (30 cm) from your Pacemaker. These include power tools,  lawn mowers, and speakers. If you are unsure if something is safe to use, ask your health care provider.  When using your cell phone, hold it to the ear that is on the opposite side from the Pacemaker. Do not leave your cell phone in a pocket over the Pacemaker.  You may safely use electric blankets, heating pads, computers, and microwave ovens.  Call the office right away if: You have chest pain. You feel more short of breath than you have felt before. You feel more light-headed than you have felt before. Your incision starts to open up.  This information is not intended to replace advice given to you by your health care provider. Make sure you discuss any questions you have with your health care provider.

## 2024-03-29 NOTE — Progress Notes (Signed)
 Discharge instructions reviewed with patient and spouse at bedside. Denies questions concerns. PT tolerated PO intake. Ambulated in the hallway, was able to void without difficulty. Seen by MD and Abbott Device Rep before discharge. EKG and CXR obtained prior to discharge. Incision site remains clean dry and intact. No s/s of complications. PT escorted from the unit via wheel chair to personal vehicle.

## 2024-03-29 NOTE — Discharge Summary (Incomplete)
° ° ° °  ELECTROPHYSIOLOGY PROCEDURE DISCHARGE SUMMARY    Patient ID: Ian Ritter,  MRN: [id number], DOB/AGE: 1941/02/14 83 y.o.  Admit date: 03/29/2024 Discharge date: 03/29/2024  Primary Care Physician: Gerome Brunet, DO  Primary Cardiologist: Darryle ONEIDA Decent, MD  Electrophysiologist: Dr. 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  Implantation of a Abbott {PPMCONFIG:28138} {Blank single:19197::There were no immediate post procedure complications.,Post-procedural complications included ***}   2.  CXR on {Blank single:19197::03/28/2024,03/29/2024} demonstrated no pneumothorax status post device implantation.       Brief HPI: Ian Ritter is a 83 y.o. male was {Blank single:19197::referred to electrophysiology in the outpatient setting for ,admitted for *** and electrophysiology team asked to see for} consideration of PPM implantation.  Past medical history includes ***.  The patient has had {Blank single:19197::symptomatic bradycardia,AV block,sinus node dysfunction} without reversible causes identified.  Risks, benefits, and alternatives to PPM implantation were reviewed with the patient who wished to proceed.   Hospital Course:  The patient was admitted and underwent implantation of a Abbott {Blank single:19197::single chamber PPM,dual chamber PPM,CRT-P,CRT-D,Micra AV Leadless PPM} with details as outlined above.  {He/she (caps):30048} was monitored on telemetry overnight which demonstrated {Blank single:19197::appropriate pacing,NSR,atrial fibrillation}.  Left chest was without hematoma or ecchymosis.  The device was interrogated and found to be functioning normally.  CXR was obtained and demonstrated {Blank single:19197::***,no  pneumothorax status post device implantation}.  Wound care, arm mobility, and restrictions were reviewed with the patient.  The patient was examined and considered stable for discharge to home.    Anticoagulation resumption {OACRESUME:27718::This patient is not on anticoagulation} {Blank single:19197::Saturday March 30, 2024,Sunday March 31, 2024,Monday April 01, 2024,Tuesday April 02, 2024}    Physical Exam: Vitals:   03/29/24 1317 03/29/24 1545  BP: (!) 145/65   Pulse: (!) 40   Resp: 17   Temp: 97.8 F (36.6 C)   TempSrc: Oral   SpO2: 99% 93%  Weight: 95.3 kg   Height: 5' 11 (1.803 m)     GEN- NAD. A&O x 3.  HEENT: Normocephalic, atraumatic Lungs- CTAB, Normal effort.  Heart- RRR, No M/G/R.  GI- Soft, NT, ND.  Extremities- No clubbing, cyanosis, or edema;  Skin- warm and dry, no rash or lesion, left chest without hematoma/ecchymosis  Discharge Medications:  Allergies as of 03/29/2024   No Known Allergies   Med Rec must be completed prior to using this SMARTLINK***       Disposition:  {Blank single:19197::Home,SNF,CIR} with usual follow up as in AVS   Duration of Discharge Encounter:  APP time: *** minutes  Signed, ***      [1] No Known Allergies

## 2024-03-29 NOTE — Interval H&P Note (Signed)
 History and Physical Interval Note:  03/29/2024 1:19 PM  Ian Ritter  has presented today for surgery, with the diagnosis of Heart Block.  The various methods of treatment have been discussed with the patient and family. After consideration of risks, benefits and other options for treatment, the patient has consented to  Procedures: PACEMAKER IMPLANT (N/A) as a surgical intervention.  The patient's history has been reviewed, patient examined, no change in status, stable for surgery.  I have reviewed the patient's chart and labs.  Questions were answered to the patient's satisfaction.     Adriana Quinby [employer]

## 2024-03-30 ENCOUNTER — Encounter (HOSPITAL_COMMUNITY): Payer: Self-pay | Admitting: Cardiology

## 2024-04-03 MED FILL — Midazolam HCl Inj 2 MG/2ML (Base Equivalent): INTRAMUSCULAR | Qty: 1 | Status: AC

## 2024-04-04 ENCOUNTER — Telehealth: Payer: Self-pay

## 2024-04-04 NOTE — Telephone Encounter (Signed)
 Follow-up after same day discharge: Implant date: 03/29/2024 MD: Soyla Norton, MD Device: PPM Location: L chest    Wound check visit: 04/09/2024 90 day MD follow-up: 07/01/2024  Remote Transmission received:yes  Dressing/sling removed: n/a  Confirm OAC restart on: yes  Please continue to monitor your cardiac device site for redness, swelling, and drainage. Call the device clinic at [phone] if you experience these symptoms, fever/chills, or have questions about your device.   Remote monitoring is used to monitor your cardiac device from home. This monitoring is scheduled every 91 days by our office. It allows us  to keep an eye on the functioning of your device to ensure it is working properly.

## 2024-04-08 NOTE — Telephone Encounter (Signed)
 Closing open encounter note.

## 2024-04-09 ENCOUNTER — Ambulatory Visit: Attending: Cardiology

## 2024-04-09 DIAGNOSIS — I442 Atrioventricular block, complete: Secondary | ICD-10-CM | POA: Diagnosis not present

## 2024-04-09 LAB — CUP PACEART INCLINIC DEVICE CHECK
Battery Remaining Longevity: 118 mo
Battery Voltage: 3.1 V
Brady Statistic RA Percent Paced: 11 %
Brady Statistic RV Percent Paced: 99.68 %
Date Time Interrogation Session: 20251223113131
Implantable Lead Connection Status: 753985
Implantable Lead Connection Status: 753985
Implantable Lead Implant Date: 20251212
Implantable Lead Implant Date: 20251212
Implantable Lead Location: 753859
Implantable Lead Location: 753860
Implantable Pulse Generator Implant Date: 20251212
Lead Channel Impedance Value: 425 Ohm
Lead Channel Impedance Value: 525 Ohm
Lead Channel Pacing Threshold Amplitude: 1 V
Lead Channel Pacing Threshold Amplitude: 1 V
Lead Channel Pacing Threshold Amplitude: 1 V
Lead Channel Pacing Threshold Pulse Width: 0.5 ms
Lead Channel Pacing Threshold Pulse Width: 0.5 ms
Lead Channel Pacing Threshold Pulse Width: 0.5 ms
Lead Channel Sensing Intrinsic Amplitude: 1 mV
Lead Channel Sensing Intrinsic Amplitude: 9.1 mV
Lead Channel Setting Pacing Amplitude: 1.25 V
Lead Channel Setting Pacing Amplitude: 3.5 V
Lead Channel Setting Pacing Pulse Width: 0.5 ms
Lead Channel Setting Sensing Sensitivity: 0.5 mV
Pulse Gen Model: 2272
Pulse Gen Serial Number: 8335285

## 2024-04-09 NOTE — Progress Notes (Signed)
 Normal dual chamber pacemaker wound check. Presenting rhythm: AS/VP 95. Wound well healed. Routine testing performed. Thresholds, sensing, and impedance consistent with implant measurements. No episodes. Reviewed arm restrictions to continue for 6 weeks total post op.  Pt enrolled in remote follow-up.  Atrial sensitivity measured at 0.37mV today.   Programming Changes: - Atrial sensitivity reprogrammed from 0.52mV to 0.39mV.

## 2024-04-09 NOTE — Patient Instructions (Signed)
" °  After Your Pacemaker   Monitor your pacemaker site for redness, swelling, and drainage. Call the device clinic at [phone] if you experience these symptoms or fever/chills.  Your incision was closed with Steri-strips or staples:  You may shower 7 days after your procedure and wash your incision with soap and water . Avoid lotions, ointments, or perfumes over your incision until it is well-healed.  You may use a hot tub or a pool after your wound check appointment if the incision is completely closed.  Do not lift, push or pull greater than 10 pounds with the affected arm until JANUARY 23rd. There are no other restrictions in arm movement after your wound check appointment.  You may drive, unless driving has been restricted by your healthcare providers.  Remote monitoring is used to monitor your pacemaker from home. This monitoring is scheduled every 91 days by our office. It allows us  to keep an eye on the functioning of your device to ensure it is working properly. You will routinely see your Electrophysiologist annually (more often if necessary).  "

## 2024-04-12 ENCOUNTER — Ambulatory Visit: Payer: Self-pay | Admitting: Cardiology

## 2024-04-29 LAB — HEPATIC FUNCTION PANEL
ALT: 30 IU/L (ref 0–44)
AST: 30 IU/L (ref 0–40)
Albumin: 4.4 g/dL (ref 3.7–4.7)
Alkaline Phosphatase: 121 IU/L (ref 48–129)
Bilirubin Total: 0.8 mg/dL (ref 0.0–1.2)
Bilirubin, Direct: 0.29 mg/dL (ref 0.00–0.40)
Total Protein: 6.8 g/dL (ref 6.0–8.5)

## 2024-04-29 LAB — LIPID PANEL
Chol/HDL Ratio: 2.5 ratio (ref 0.0–5.0)
Cholesterol, Total: 94 mg/dL — ABNORMAL LOW (ref 100–199)
HDL: 38 mg/dL — ABNORMAL LOW
LDL Chol Calc (NIH): 37 mg/dL (ref 0–99)
Triglycerides: 97 mg/dL (ref 0–149)
VLDL Cholesterol Cal: 19 mg/dL (ref 5–40)

## 2024-04-29 NOTE — Progress Notes (Signed)
 " Cardiology Office Note:  .   Date:  05/07/2024  ID:  Ian Ritter, DOB 1940/11/01, MRN [id number] PCP: Ian Brunet, DO   HeartCare Providers Cardiologist:  Ian ONEIDA Decent, MD Cardiology APP:  Goodrich, Callie E, PA-C   History of Present Illness: .    Chief Complaint  Patient presents with   Follow-up    Ian Ritter is a 84 y.o. male with below history who presents for follow-up.   History of Present Illness   Ian Ritter is an 84 year old male with coronary artery disease and complete heart block who presents for follow-up. He is accompanied by his wife. He was referred by his primary care physician for evaluation of shortness of breath and subsequent cardiac issues.  During the summer, he experienced shortness of breath when walking uphill, which was unusual for him. This led to a referral to a cardiologist, where a blockage was identified, resulting in a stent placement on February 19, 2024. Following the procedure, he developed a complete heart block and required a pacemaker implant. Currently, no shortness of breath or chest discomfort, and he states that he is doing well.  He is on aspirin  and Plavix  (clopidogrel ) following the stent placement and takes Lipitor 80 mg for cholesterol management. His most recent cholesterol levels show an LDL of 37.  His past medical history includes hypertension, hyperlipidemia, and chronic kidney disease. He has undergone back surgery and hernia surgery in the past. He denies any history of heart attack or stroke and is not diabetic.  His family history is significant for heart disease, as both his mother and father had it. He does not smoke or consume alcohol. He is retired, having worked as an acupuncturist, and has three children, two grandchildren, and one great-grandchild.           Problem List CAD -PCI pLAD 02/19/2024 CHB -ppm 03/29/2024 HTN HLD -T chol 94, HDL 38, LDL 37, TG 97 CKD 3a    ROS: All  other ROS reviewed and negative. Pertinent positives noted in the HPI.     Studies Reviewed: SABRA   EKG Interpretation Date/Time:  Tuesday May 07 2024 14:37:17 EST Ventricular Rate:  78 PR Interval:  256 QRS Duration:  128 QT Interval:  402 QTC Calculation: 458 R Axis:   -55  Text Interpretation: Sinus rhythm with first degree AV block Left axis deviation Right bundle branch block Left ventricular hypertrophy with repolarization abnormality ( R in aVL , Romhilt-Estes ) Inferior infarct , age undetermined Confirmed by Ritter Ian [id number]) on 05/07/2024 3:00:50 PM   LHC 02/19/2024 Severe single-vessel coronary artery disease with sequential 80-90% and heavily calcified 50% proximal/mid LAD stenoses.  LCx and RCA are without significant disease. Mildly elevated left ventricular filling pressure (LVEDP 20 mmHg). 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  This persisted throughout the case with an underlying junctional escape rhythm (patient has RBBB at baseline). Successful IVUS-guided PCI to the proximal/mid LAD using a Synergy XD 3.0 x 28 mm drug-eluting stent with 0% residual stenosis and TIMI-3 flow.  TTE 02/20/2024  1. Indeteriminate diastology- appears to have 2:1 heart block on spectral  Doppler- clinical correlation and telemetry assessment advised. Left  ventricular ejection fraction, by estimation, is 60 to 65%. The left  ventricle has normal function. The left  ventricle has no regional wall motion abnormalities. There is mild left  ventricular hypertrophy. Left ventricular diastolic parameters are  indeterminate.  2. Right ventricular systolic function is normal. The right ventricular  size is mildly enlarged.   3. The mitral valve is normal in structure. No evidence of mitral valve  regurgitation. No evidence of mitral stenosis.   4. The aortic valve is tricuspid. Aortic valve regurgitation is trivial.  No aortic stenosis is present.   5. The  inferior vena cava is normal in size with greater than 50%  respiratory variability, suggesting right atrial pressure of 3 mmHg.   Physical Exam:   VS:  BP (!) 142/79 (BP Location: Right Arm, Patient Position: Sitting, Cuff Size: Normal)   Pulse 80   Ht 5' 11 (1.803 m)   Wt 214 lb 12.8 oz (97.4 kg)   SpO2 98%   BMI 29.96 kg/m    Wt Readings from Last 3 Encounters:  05/07/24 214 lb 12.8 oz (97.4 kg)  03/29/24 210 lb (95.3 kg)  03/07/24 215 lb 9.6 oz (97.8 kg)    GEN: Well nourished, well developed in no acute distress NECK: No JVD; No carotid bruits CARDIAC: RRR, no murmurs, rubs, gallops RESPIRATORY:  Clear to auscultation without rales, wheezing or rhonchi  ABDOMEN: Soft, non-tender, non-distended EXTREMITIES:  No edema; No deformity  ASSESSMENT AND PLAN: .   Assessment and Plan    Coronary Artery Disease (CAD) status post Percutaneous Coronary Intervention (PCI) with stent placement in the proximal LAD Asymptomatic post-PCI with well-controlled LDL. Continues DAPT. Discussed cardiac rehab benefits and opted for home exercise initially. - Continue aspirin  and clopidogrel  for 6 months post-stent. - Continue atorvastatin  80 mg daily. - Refer to cardiac rehabilitation. - Encourage 150 minutes of moderate-intensity exercise per week. - Schedule follow-up in 4 months for clopidogrel  evaluation. - Schedule annual cardiologist follow-up.  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 No specific heart rate target due to pacemaker. - Continue routine pacemaker checks with specialist.  Hypertension Well-controlled with current regimen. - Continue current antihypertensive regimen.  Hyperlipidemia Well-managed with atorvastatin , LDL at 37 mg/dL. - Continue atorvastatin  80 mg daily.  Chronic Kidney Disease (CKD) Kidney function monitored, no acute issues.         Follow-up: Return in about 4 months (around  09/04/2024).  Signed, Ian DASEN. Barbaraann, MD, [facility]  [facility]  [address] [place], [place] [address] [phone]  3:39 PM   "

## 2024-05-07 ENCOUNTER — Ambulatory Visit: Attending: Cardiovascular Disease | Admitting: Cardiovascular Disease

## 2024-05-07 ENCOUNTER — Encounter: Payer: Self-pay | Admitting: Cardiovascular Disease

## 2024-05-07 VITALS — BP 142/79 | HR 80 | Ht 71.0 in | Wt 214.8 lb

## 2024-05-07 DIAGNOSIS — Z950 Presence of cardiac pacemaker: Secondary | ICD-10-CM | POA: Diagnosis not present

## 2024-05-07 DIAGNOSIS — N1831 Chronic kidney disease, stage 3a: Secondary | ICD-10-CM

## 2024-05-07 DIAGNOSIS — I442 Atrioventricular block, complete: Secondary | ICD-10-CM

## 2024-05-07 DIAGNOSIS — I2510 Atherosclerotic heart disease of native coronary artery without angina pectoris: Secondary | ICD-10-CM

## 2024-05-07 DIAGNOSIS — I10 Essential (primary) hypertension: Secondary | ICD-10-CM

## 2024-05-07 DIAGNOSIS — E785 Hyperlipidemia, unspecified: Secondary | ICD-10-CM

## 2024-05-07 NOTE — Patient Instructions (Signed)
 Medication Instructions:  No changes *If you need a refill on your cardiac medications before your next appointment, please call your pharmacy*  Lab Work: None ordered If you have labs (blood work) drawn today and your tests are completely normal, you will receive your results only by: MyChart Message (if you have MyChart) OR A paper copy in the mail If you have any lab test that is abnormal or we need to change your treatment, we will call you to review the results.  Testing/Procedures: None ordered  Follow-Up: At [facility], you and your health needs are our priority.  As part of our continuing mission to provide you with exceptional heart care, our providers are all part of one team.  This team includes your primary Cardiologist (physician) and Advanced Practice Providers or APPs (Physician Assistants and Nurse Practitioners) who all work together to provide you with the care you need, when you need it.  Your next appointment:   4 months with Callie Goodrich, PA-C  60yr with Dr Barbaraann  We recommend signing up for the patient portal called MyChart.  Sign up information is provided on this After Visit Summary.  MyChart is used to connect with patients for Virtual Visits (Telemedicine).  Patients are able to view lab/test results, encounter notes, upcoming appointments, etc.  Non-urgent messages can be sent to your provider as well.   To learn more about what you can do with MyChart, go to [email or web address].

## 2024-05-10 ENCOUNTER — Ambulatory Visit: Attending: Family Medicine

## 2024-05-10 DIAGNOSIS — I442 Atrioventricular block, complete: Secondary | ICD-10-CM | POA: Diagnosis not present

## 2024-05-12 ENCOUNTER — Other Ambulatory Visit: Payer: Self-pay | Admitting: Student

## 2024-05-12 DIAGNOSIS — E785 Hyperlipidemia, unspecified: Secondary | ICD-10-CM

## 2024-05-14 DIAGNOSIS — E785 Hyperlipidemia, unspecified: Secondary | ICD-10-CM

## 2024-05-14 MED ORDER — ATORVASTATIN CALCIUM 80 MG PO TABS: 80.0000 mg | ORAL_TABLET | Freq: Every day | ORAL | 3 refills | Status: AC

## 2024-05-15 LAB — CUP PACEART REMOTE DEVICE CHECK
Battery Remaining Longevity: 104 mo
Battery Remaining Percentage: 95.5 %
Battery Voltage: 3.04 V
Brady Statistic AP VP Percent: 8.8 %
Brady Statistic AP VS Percent: 1 %
Brady Statistic AS VP Percent: 91 %
Brady Statistic AS VS Percent: 1 %
Brady Statistic RA Percent Paced: 8.6 %
Brady Statistic RV Percent Paced: 99 %
Date Time Interrogation Session: 20260123020013
Implantable Lead Connection Status: 753985
Implantable Lead Connection Status: 753985
Implantable Lead Implant Date: 20251212
Implantable Lead Implant Date: 20251212
Implantable Lead Location: 753859
Implantable Lead Location: 753860
Implantable Pulse Generator Implant Date: 20251212
Lead Channel Impedance Value: 540 Ohm
Lead Channel Impedance Value: 540 Ohm
Lead Channel Pacing Threshold Amplitude: 0.875 V
Lead Channel Pacing Threshold Amplitude: 1 V
Lead Channel Pacing Threshold Pulse Width: 0.5 ms
Lead Channel Pacing Threshold Pulse Width: 0.5 ms
Lead Channel Sensing Intrinsic Amplitude: 2 mV
Lead Channel Sensing Intrinsic Amplitude: 7.6 mV
Lead Channel Setting Pacing Amplitude: 1.125
Lead Channel Setting Pacing Amplitude: 3.5 V
Lead Channel Setting Pacing Pulse Width: 0.5 ms
Lead Channel Setting Sensing Sensitivity: 0.5 mV
Pulse Gen Model: 2272
Pulse Gen Serial Number: 8335285

## 2024-05-16 ENCOUNTER — Ambulatory Visit: Payer: Self-pay | Admitting: Cardiology

## 2024-05-16 NOTE — Progress Notes (Signed)
 Remote PPM Transmission

## 2024-07-01 ENCOUNTER — Ambulatory Visit: Admitting: Cardiology

## 2024-08-09 ENCOUNTER — Ambulatory Visit

## 2024-08-29 ENCOUNTER — Ambulatory Visit: Admitting: Student

## 2024-09-11 ENCOUNTER — Ambulatory Visit: Admitting: Student

## 2024-11-08 ENCOUNTER — Ambulatory Visit

## 2025-02-07 ENCOUNTER — Ambulatory Visit

## 2025-05-09 ENCOUNTER — Ambulatory Visit
